# Patient Record
Sex: Female | Born: 1985 | Race: White | Hispanic: No | Marital: Married | Smoking: Former smoker
Health system: Southern US, Community
[De-identification: ages and names within clinical notes are randomized; demographics above are authoritative.]

## PROBLEM LIST (undated history)

## (undated) DIAGNOSIS — R7303 Prediabetes: Secondary | ICD-10-CM

## (undated) DIAGNOSIS — D369 Benign neoplasm, unspecified site: Secondary | ICD-10-CM

## (undated) DIAGNOSIS — E282 Polycystic ovarian syndrome: Secondary | ICD-10-CM

## (undated) DIAGNOSIS — N200 Calculus of kidney: Secondary | ICD-10-CM

## (undated) DIAGNOSIS — Z87442 Personal history of urinary calculi: Secondary | ICD-10-CM

## (undated) HISTORY — PX: DENTAL SURGERY: SHX609

## (undated) HISTORY — DX: Polycystic ovarian syndrome: E28.2

## (undated) HISTORY — PX: TONSILLECTOMY: SUR1361

## (undated) HISTORY — DX: Benign neoplasm, unspecified site: D36.9

## (undated) HISTORY — PX: UTERINE SEPTUM RESECTION: SHX5386

## (undated) HISTORY — DX: Prediabetes: R73.03

---
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
## 2006-07-16 ENCOUNTER — Ambulatory Visit (HOSPITAL_COMMUNITY): Admission: RE | Admit: 2006-07-16 | Discharge: 2006-07-16 | Payer: Self-pay | Admitting: Cardiology

## 2006-10-12 ENCOUNTER — Emergency Department (HOSPITAL_COMMUNITY): Admission: EM | Admit: 2006-10-12 | Discharge: 2006-10-12 | Payer: Self-pay | Admitting: Emergency Medicine

## 2007-04-25 ENCOUNTER — Emergency Department (HOSPITAL_COMMUNITY): Admission: EM | Admit: 2007-04-25 | Discharge: 2007-04-26 | Payer: Self-pay | Admitting: Emergency Medicine

## 2007-04-27 ENCOUNTER — Ambulatory Visit: Payer: Self-pay | Admitting: Orthopedic Surgery

## 2007-06-01 ENCOUNTER — Ambulatory Visit: Payer: Self-pay | Admitting: Orthopedic Surgery

## 2007-07-02 ENCOUNTER — Ambulatory Visit: Payer: Self-pay | Admitting: Orthopedic Surgery

## 2010-10-23 ENCOUNTER — Emergency Department (HOSPITAL_COMMUNITY): Admission: EM | Admit: 2010-10-23 | Discharge: 2010-10-23 | Payer: Self-pay | Admitting: Emergency Medicine

## 2011-02-16 ENCOUNTER — Emergency Department (HOSPITAL_COMMUNITY)
Admission: EM | Admit: 2011-02-16 | Discharge: 2011-02-17 | Disposition: A | Discharge status: Home / Self Care | Payer: Self-pay | Attending: Emergency Medicine | Admitting: Emergency Medicine

## 2011-02-16 DIAGNOSIS — M542 Cervicalgia: Secondary | ICD-10-CM | POA: Insufficient documentation

## 2011-02-16 DIAGNOSIS — X58XXXA Exposure to other specified factors, initial encounter: Secondary | ICD-10-CM | POA: Insufficient documentation

## 2011-02-16 DIAGNOSIS — S239XXA Sprain of unspecified parts of thorax, initial encounter: Secondary | ICD-10-CM | POA: Insufficient documentation

## 2011-02-16 DIAGNOSIS — M546 Pain in thoracic spine: Secondary | ICD-10-CM | POA: Insufficient documentation

## 2011-02-16 DIAGNOSIS — R071 Chest pain on breathing: Secondary | ICD-10-CM | POA: Insufficient documentation

## 2011-05-17 NOTE — Op Note (Signed)
Linda Burnett, Linda Burnett                        ACCOUNT NO.:  [id number]   MEDICAL RECORD NO.:  [id number]                   PATIENT TYPE:  AMB   LOCATION:  DSC                                  FACILITY:  [facility]   PHYSICIAN:  David L. Annalee Genta, M.D.            DATE OF BIRTH:  [birth year]   DATE OF PROCEDURE:  08/07/2004  DATE OF DISCHARGE:                                 OPERATIVE REPORT   PREOPERATIVE DIAGNOSES:  1. Recurrent acute tonsillitis.  2. Adenotonsillar hypertrophy.   POSTOPERATIVE DIAGNOSES:  1. Recurrent acute tonsillitis.  2. Adenotonsillar hypertrophy.   OPERATION PERFORMED:  Tonsillectomy and adenoidectomy (adenoid ablation).   SURGEON:  Kinnie Scales. Annalee Genta, M.D.   ANESTHESIA:  General endotracheal.   COMPLICATIONS:  None.   ESTIMATED BLOOD LOSS:  Minimal.   DISPOSITION:  Patient transferred from the operating room to the recovery  room in stable condition.   INDICATIONS FOR PROCEDURE:  Pa is an almost 25 year old white female  who was referred for evaluation of recurrent tonsillitis and significant  adenotonsillar hypertrophy.  The patient had numerous infections in the last  year requiring multiple courses of antibiotics.  Given her history and  physical findings, I recommended that we consider her for tonsillectomy and  adenoidectomy under general anesthesia.  The risks, benefits, and possible  complications of the surgical procedures were discussed in detail with the  patient and her mother, who understood and concurred with our plan for  surgery which was scheduled as above.   DESCRIPTION OF PROCEDURE:  The patient was brought to the operating room on  August 07, 2004 and placed in supine position on the operating table.  General endotracheal anesthesia was established without difficulty.  When  the patient was adequately anesthetized, a Crowe-Davis mouth gag was  inserted without difficulty.  There were no loose or broken teeth.  The  surgical  procedure was begun with adenoidectomy.  The posterior nasopharynx  was thoroughly examined.  There was significant adenoidal hypertrophy.  Using Bovie electrocautery the excessive adenoidal tissue was ablated.  With  the nasopharynx widely patent, attention was then turned to the tonsils.   Beginning on the left hand side and dissecting in subcapsular fashion, the  entire left tonsil was resected from superior pole to tongue base. There was  a moderate amount of scarring in the superior aspect of the tonsillar fossa  and a small amount of arterial bleeding which was cauterized with suction  cautery.  On the right hand side, a similar procedure was carried out using  a Harmonic scalpel and dissecting in subcapsular fashion, the entire right  tonsil was resected from the superior pole to tongue base.  Tonsillar tissue  was sent to pathology for gross and microscopic evaluation.  Tonsillar fossa  was gently abraded with a dry tonsil sponge.  Several areas of point  hemorrhage were cauterized.  Crowe-Davis mouth gag was released and  reapplied and there  was no active bleeding.  An orogastric tube was passed  and stomach contents were aspirated.  The patient's oral cavity, oropharynx,  nasal cavity and nasopharynx were then thoroughly irrigated and suctioned.  The patient's Crowe-Davis mouth gag was then released and removed.  There  were no loose or broken teeth and no active bleeding.  She was then awakened  from her anesthetic, extubated and transferred from the operating room to  the recovery room in stable condition.  There were no complications.                                               Kinnie Scales. Annalee Genta, M.D.    DLS/MEDQ  D:  16/09/9603  T:  08/07/2004  Job:  [id number]

## 2011-05-17 NOTE — Op Note (Signed)
NAMECORLENE, SABIA                        ACCOUNT NO.:  [id number]   MEDICAL RECORD NO.:  [id number]                   PATIENT TYPE:  OBV   LOCATION:  1824                                 FACILITY:  [facility]   PHYSICIAN:  Kinnie Scales. Annalee Genta, M.D.            DATE OF BIRTH:  1986/05/24   DATE OF PROCEDURE:  08/13/2004  DATE OF DISCHARGE:                                 OPERATIVE REPORT   PREOPERATIVE DIAGNOSIS:  1. Posttonsillectomy hemorrhage.  2. Status post tonsillectomy and adenoidectomy on August 07, 2004.   POSTOPERATIVE DIAGNOSIS:  1. Posttonsillectomy hemorrhage.  2. Status post tonsillectomy and adenoidectomy on August 07, 2004.   OPERATION PERFORMED:  1. Examination and cautery of posttonsillectomy hemorrhage.  2. Gastric lavage.   SURGEON:  Kinnie Scales. Annalee Genta, M.D.   ANESTHESIA:  General endotracheal.   COMPLICATIONS:  None.   ESTIMATED BLOOD LOSS:  Approximately 50 mL.   The patient was transferred from the operating room to the recovery room in  stable condition.   INDICATIONS FOR PROCEDURE:  The patient is a 25 year old white female who  underwent tonsillectomy and adenoidectomy under general anesthesia on August 07, 2004 at [facility].  Surgery was uneventful and was  performed for a history of recurrent acute tonsillitis, adenotonsillar  hypertrophy.  No blood loss at the time of surgery.  The patient tolerated  the procedure without difficulty and was discharged home later the same day.  On the morning on August 13, 2004, the patient had acute tonsillectomy  hemorrhage.  After waking in the morning, she developed acute bleeding and  she was transferred via EMS to [facility] Emergency [facility].  She was  evaluated in the emergency department and found to have significant clotting  in the right peritonsillar fossa.  Given her history and physical findings,  she was transferred from the emergency room to the operating room for  surgical  management of posttonsillectomy hemorrhage.   DESCRIPTION OF PROCEDURE:  The patient was brought to the operating room on  August 13, 2004 and placed in supine position on the operating table.  General endotracheal anesthesia was established without difficulty.  There  was no active bleeding and there was no evidence of aspiration. Once the  patient's airway had been secured and she was adequately anesthetized, the  Crowe-Davis mouth gag was inserted.  The patient's oral cavity and  oropharynx were examined.  There was heavy clotting in the right tonsillar  fossa and this was gently removed.  Several areas of point hemorrhage were  noted including a small blood vessel in the inferior aspect of the right  tonsillar fossa.  This was cauterized with suction cautery.  Tonsillar fossa  were then gently abraded with a dry tonsil sponge and several other small  areas of point hemorrhage were also cauterized with suction cautery and  there was no active bleeding at the conclusion of the procedure.  The CroweEarlene Plater mouth gag was released and reapplied.  Again, there was no active  bleeding.  A 16 French orogastric tube was passed without difficulty and the  patient's stomach was thoroughly lavaged with approximately 300 mL of warm  sterile saline.  A moderate amount of bloody clotted material was aspirated  from the patient's stomach.  This was continued until there was no further  clot within the stomach. The patient was then awakened from her anesthetic.  The Crowe-Davis mouth gag was released and removed.  There was no active  bleeding.  She was extubated and was transferred from the operating room to  the recovery room in stable condition.                                               Kinnie Scales. Annalee Genta, M.D.    DLS/MEDQ  D:  47/82/9562  T:  08/13/2004  Job:  [id number]

## 2014-03-08 ENCOUNTER — Ambulatory Visit (INDEPENDENT_AMBULATORY_CARE_PROVIDER_SITE_OTHER): Payer: PRIVATE HEALTH INSURANCE | Admitting: Obstetrics & Gynecology

## 2014-03-08 ENCOUNTER — Encounter (INDEPENDENT_AMBULATORY_CARE_PROVIDER_SITE_OTHER): Payer: Self-pay

## 2014-03-08 ENCOUNTER — Encounter: Payer: Self-pay | Admitting: Obstetrics & Gynecology

## 2014-03-08 VITALS — BP 110/90 | Ht 65.0 in | Wt 241.0 lb

## 2014-03-08 DIAGNOSIS — N912 Amenorrhea, unspecified: Secondary | ICD-10-CM

## 2014-03-08 MED ORDER — NITROFURANTOIN MONOHYD MACRO 100 MG PO CAPS: 100.0000 mg | ORAL_CAPSULE | Freq: Two times a day (BID) | ORAL | Status: DC

## 2014-03-08 MED ORDER — MEDROXYPROGESTERONE ACETATE 10 MG PO TABS: 10.0000 mg | ORAL_TABLET | Freq: Every day | ORAL | Status: DC

## 2014-03-08 MED ORDER — CLOMIPHENE CITRATE 50 MG PO TABS: 100.0000 mg | ORAL_TABLET | Freq: Every day | ORAL | Status: DC

## 2014-03-08 NOTE — Addendum Note (Signed)
Addended by: Florian Buff on: 03/08/2014 04:12 PM   Modules accepted: Orders

## 2014-03-08 NOTE — Progress Notes (Signed)
Patient ID: Sunni Richardson, female   DOB: 1986/10/18, 28 y.o.   MRN: [id number] November 2014 Semen ok by report Anovulatory  Infrequent intercourse  No meds  Provera Clomid and will do follicular dating  Past Medical History  Diagnosis Date  . Polycystic ovary syndrome     Past Surgical History  Procedure Laterality Date  . Tonsillectomy and adenoidectomy      OB History   Grav Para Term Preterm Abortions TAB SAB Ect Mult Living                  Not on File  History   Social History  . Marital Status: Single    Spouse Name: N/A    Number of Children: N/A  . Years of Education: N/A   Social History Main Topics  . Smoking status: Current Every Day Smoker  . Smokeless tobacco: None  . Alcohol Use: None  . Drug Use: None  . Sexual Activity: Yes   Other Topics Concern  . None   Social History Narrative  . None    Family History  Problem Relation Age of Onset  . Colon cancer Father   . Diabetes Maternal Grandmother   . Diabetes Paternal Grandmother   . Colon cancer Paternal Grandmother

## 2014-04-15 ENCOUNTER — Telehealth: Payer: Self-pay | Admitting: Obstetrics & Gynecology

## 2014-04-15 NOTE — Telephone Encounter (Signed)
Pt started period today when does she need to start Clomid. Pt informed to start Clomid today. Pt states Dr. Elonda Husky wanted to do follicullar dating. Appointment for ultrasound and Dr. Elonda Husky made for April 28, 2014 day 14 of cycle.

## 2014-04-27 ENCOUNTER — Other Ambulatory Visit: Payer: Self-pay | Admitting: Obstetrics & Gynecology

## 2014-04-27 DIAGNOSIS — N970 Female infertility associated with anovulation: Secondary | ICD-10-CM

## 2014-04-28 ENCOUNTER — Ambulatory Visit (INDEPENDENT_AMBULATORY_CARE_PROVIDER_SITE_OTHER): Payer: PRIVATE HEALTH INSURANCE

## 2014-04-28 ENCOUNTER — Encounter: Payer: Self-pay | Admitting: Obstetrics & Gynecology

## 2014-04-28 ENCOUNTER — Ambulatory Visit (INDEPENDENT_AMBULATORY_CARE_PROVIDER_SITE_OTHER): Payer: PRIVATE HEALTH INSURANCE | Admitting: Obstetrics & Gynecology

## 2014-04-28 VITALS — BP 128/80 | Wt 241.0 lb

## 2014-04-28 DIAGNOSIS — N912 Amenorrhea, unspecified: Secondary | ICD-10-CM

## 2014-04-28 DIAGNOSIS — N970 Female infertility associated with anovulation: Secondary | ICD-10-CM

## 2014-04-28 MED ORDER — MEDROXYPROGESTERONE ACETATE 10 MG PO TABS: 10.0000 mg | ORAL_TABLET | Freq: Every day | ORAL | Status: DC

## 2014-04-28 MED ORDER — CLOMIPHENE CITRATE 50 MG PO TABS: ORAL_TABLET | ORAL | Status: DC

## 2014-04-28 NOTE — Progress Notes (Signed)
Patient ID: Linda Burnett, female   DOB: [birth year], 28 y.o.   MRN: [id number] Anovulatory On clomid 100 mg daily  US Transvaginal Non-ob  04/28/2014   GYNECOLOGIC SONOGRAM   Linda Burnett is a 28 y.o.  for a pelvic sonogram for infertility  (anovulation). LMP is 04/15/2014 (Day 14) pt had taken clomid on days 1-5  Uterus                      8.3 x 4.9 x 3.8 cm, anteverted uterus noted no  myometrial masses noted within  Endometrium          13.1 mm, symmetrical, small amount of fluid noted  within cervical cavity  Right ovary             4.1 x 2.3 x 2.0 cm, largest follicle=12 x 24mm all  others 32mm or less  Left ovary                3.3 x 2.5 x 2.3 cm, largest follicles noted 7 &  6 mm  No free fluid noted within pelvis   Technician Comments:  Anteverted uterus noted, Endom-13.15mm,  Small amount of fluid noted within  cervical cavity, bilateral adnexa/ovaries appear WNL no free fluid or  adnexal masses noted    Linda Burnett 04/28/2014 11:27 AM   Clinical Impression and recommendations:  I have reviewed the sonogram results above, combined with the patient's  current clinical course, below are my impressions and any appropriate  recommendations for management based on the sonographic findings.  No dominant follicle Good estrogenic effect of endometrium  Increase to 150 mg  Linda Burnett 04/28/2014 11:40 AM   No dominat follicle  Increase to 093 mg daily Provera given Call with menses

## 2014-05-12 ENCOUNTER — Telehealth: Payer: Self-pay | Admitting: Obstetrics & Gynecology

## 2014-05-12 ENCOUNTER — Telehealth: Payer: Self-pay | Admitting: Adult Health

## 2014-05-12 NOTE — Telephone Encounter (Signed)
Left message x 1. JSY 

## 2014-05-12 NOTE — Telephone Encounter (Signed)
Yes related to period yes she needs to make appointment for sonogram for 05/25/2014 the see me

## 2014-05-12 NOTE — Telephone Encounter (Signed)
Spoke with pt. Pt started her period last pm and started Clomid this am. Pt states she is having pain in her pelvic area that goes into her legs. Pt wonders if it could be coming from the Provera. I advised it could be period related, but pt states she has never had this before. Pt also wonders if she will need another internal [place]. Please advise. Thanks!!!

## 2014-05-12 NOTE — Telephone Encounter (Signed)
Linda Burnett spoke with pt and gave Dr. Brynda Greathouse recommendations. [place]

## 2014-05-12 NOTE — Telephone Encounter (Signed)
Pt informed per Dr. Elonda Husky pain in pelvic area and legs related to period. Call transferred to front staff for an appt with Dr. Elonda Husky and sonogram to be made 05/25/2014.

## 2014-05-24 ENCOUNTER — Other Ambulatory Visit: Payer: Self-pay | Admitting: Obstetrics & Gynecology

## 2014-05-24 DIAGNOSIS — N949 Unspecified condition associated with female genital organs and menstrual cycle: Secondary | ICD-10-CM

## 2014-05-25 ENCOUNTER — Other Ambulatory Visit: Payer: Self-pay | Admitting: Obstetrics & Gynecology

## 2014-05-25 ENCOUNTER — Ambulatory Visit (INDEPENDENT_AMBULATORY_CARE_PROVIDER_SITE_OTHER): Payer: PRIVATE HEALTH INSURANCE | Admitting: Obstetrics & Gynecology

## 2014-05-25 ENCOUNTER — Ambulatory Visit (INDEPENDENT_AMBULATORY_CARE_PROVIDER_SITE_OTHER): Payer: PRIVATE HEALTH INSURANCE

## 2014-05-25 ENCOUNTER — Encounter: Payer: Self-pay | Admitting: Obstetrics & Gynecology

## 2014-05-25 VITALS — BP 122/80 | Wt 238.0 lb

## 2014-05-25 DIAGNOSIS — E282 Polycystic ovarian syndrome: Secondary | ICD-10-CM

## 2014-05-25 DIAGNOSIS — N970 Female infertility associated with anovulation: Secondary | ICD-10-CM

## 2014-05-25 DIAGNOSIS — N949 Unspecified condition associated with female genital organs and menstrual cycle: Secondary | ICD-10-CM

## 2014-05-25 DIAGNOSIS — IMO0002 Reserved for concepts with insufficient information to code with codable children: Secondary | ICD-10-CM

## 2014-05-25 DIAGNOSIS — N946 Dysmenorrhea, unspecified: Secondary | ICD-10-CM

## 2014-05-25 DIAGNOSIS — N979 Female infertility, unspecified: Secondary | ICD-10-CM

## 2014-05-25 MED ORDER — METFORMIN HCL 500 MG PO TABS: 500.0000 mg | ORAL_TABLET | Freq: Two times a day (BID) | ORAL | Status: DC

## 2014-05-25 MED ORDER — DESOGESTREL-ETHINYL ESTRADIOL 0.15-30 MG-MCG PO TABS: 1.0000 | ORAL_TABLET | Freq: Every day | ORAL | Status: DC

## 2014-08-29 ENCOUNTER — Other Ambulatory Visit: Payer: Self-pay | Admitting: Obstetrics & Gynecology

## 2014-08-29 DIAGNOSIS — N970 Female infertility associated with anovulation: Secondary | ICD-10-CM | POA: Insufficient documentation

## 2014-08-29 NOTE — Progress Notes (Signed)
Patient ID: Linda Burnett, female   DOB: 07-Aug-1986, 28 y.o.   MRN: [id number] Linda Burnett is a 28 y.o. No obstetric history on file. LMP 05/11/2014 for a pelvic sonogram for dysmenorrhea and PCO. Pt had 150mg  Clomid on days 1-5, today is day 15 of cycle.  Uterus 7.7 x 4.9 x 3.6 cm, anteverted  Endometrium 10.7 mm, symmetrical,  Right ovary 3.3 x 2.3 x 4.0ZJ multiple follicles noted all <0DU  Left ovary 3.3 x 2.7 x 2.2 cm, multiple follicles noted all <4RC  No free fluid or adnexal masses noted within the uterus  Technician Comments:  Anteverted uterus, Endom-10.80mm, bilateral adnexa/ovaries size/shape appears WNL, bilateral ovaries appear with multiple follicles noted which all measure <1cm.  Alicia Amel  05/25/2014  3:02 PM  No dominant follicle on Clomid 381 mg  Otherwise normal sonogram  Florian Buff  05/25/2014  3:30 PM       Result History    US Transvaginal Non-OB (Order [id number]) on 5   Sonogram above reveals no dominant follicles On 360 mg days 1-5  Will add metformin 500 BID to augment the clomid in a PCOS type anovulatory patient Pt to call if problems with metformin  Recommend pt consider follow up with [facility] for ovulation induction, discussed

## 2015-01-30 ENCOUNTER — Telehealth: Payer: Self-pay | Admitting: *Deleted

## 2015-01-30 NOTE — Telephone Encounter (Signed)
Pt states has had 2 positive pregnancy test at home, LMP 11/2014. Does she need to have blood work due to her history of irregular periods and anovulation. Call transferred to front staff for an appt to be scheduled for pregnancy test.

## 2015-01-31 ENCOUNTER — Encounter: Payer: PRIVATE HEALTH INSURANCE | Admitting: Adult Health

## 2015-09-29 ENCOUNTER — Encounter: Payer: Self-pay | Admitting: Cardiology

## 2015-09-29 ENCOUNTER — Ambulatory Visit (INDEPENDENT_AMBULATORY_CARE_PROVIDER_SITE_OTHER): Payer: BLUE CROSS/BLUE SHIELD | Admitting: Cardiology

## 2015-09-29 VITALS — BP 113/78 | HR 54 | Ht 65.0 in | Wt 236.0 lb

## 2015-09-29 DIAGNOSIS — Z8249 Family history of ischemic heart disease and other diseases of the circulatory system: Secondary | ICD-10-CM

## 2015-09-29 DIAGNOSIS — Z136 Encounter for screening for cardiovascular disorders: Secondary | ICD-10-CM | POA: Diagnosis not present

## 2015-09-29 DIAGNOSIS — R7303 Prediabetes: Secondary | ICD-10-CM

## 2015-09-29 DIAGNOSIS — R7309 Other abnormal glucose: Secondary | ICD-10-CM | POA: Diagnosis not present

## 2015-09-29 DIAGNOSIS — R002 Palpitations: Secondary | ICD-10-CM | POA: Diagnosis not present

## 2015-09-29 NOTE — Patient Instructions (Signed)
Your physician recommends that you continue on your current medications as directed. Please refer to the Current Medication list given to you today. Your physician has recommended that you wear an event monitor for 14 days. Event monitors are medical devices that record the heart's electrical activity. Doctors most often [place] these monitors to diagnose arrhythmias. Arrhythmias are problems with the speed or rhythm of the heartbeat. The monitor is a small, portable device. You can wear one while you do your normal daily activities. This is usually used to diagnose what is causing palpitations/syncope (passing out). We will call you with your results.

## 2015-09-29 NOTE — Progress Notes (Signed)
Cardiology Office Note  Date: 09/29/2015   ID: Linda Burnett, DOB November 13, 1986, MRN [id number]  PCP: BURNS, Chrystine Oiler, MD  Consulting Cardiologist: Rozann Lesches, MD   Chief Complaint  Patient presents with  . Palpitations    History of Present Illness: Linda Burnett is a 29 y.o. female referred for cardiology consultation by Dr. Quay Burow. She reports a history of palpitations, noted over a period a several weeks, and at their worst occurring at least 3 times a week. She reported no obvious trigger for these symptoms, felt like her heart was irregular and rapid. Generally the symptoms would last for about 15-20 minutes and then resolve. She had no lightheadedness or syncope, no chest discomfort. Over the last month or so she has stopped smoking and also cut back caffeine significantly. This has resulted in significant decrease in feeling of palpitations.  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 States that she is now considering a regular exercise program. She seems to be focused on improving her overall health, states that her uncle died in his 54s with a myocardial infarction.  ECG done today shows sinus bradycardia with nonspecific T-wave changes.   Past Medical History  Diagnosis Date  . Polycystic ovary syndrome   . Prediabetes     Past Surgical History  Procedure Laterality Date  . Tonsillectomy    . Dental surgery      Current Outpatient Prescriptions  Medication Sig Dispense Refill  . metFORMIN (GLUCOPHAGE) 500 MG tablet Take 1 tablet (500 mg total) by mouth 2 (two) times daily with a meal. 60 tablet 2   No current facility-administered medications for this visit.    Allergies:  Review of patient's allergies indicates not on file.   Social History: The patient  reports that she quit smoking about 5 weeks ago. Her smoking use included Cigarettes. She does not have any smokeless tobacco history on file. She reports that she does not drink  alcohol or use illicit drugs.   Family History: The patient's family history includes Colon cancer in her father and paternal grandmother; Diabetes in her maternal grandmother and paternal grandmother; Heart attack in her maternal uncle.   ROS:  Please see the history of present illness. Otherwise, complete review of systems is positive for none.  All other systems are reviewed and negative.   Physical Exam: VS:  BP 113/78 mmHg  Pulse 54  Ht 5\' 5"  (1.651 m)  Wt 236 lb (107.049 kg)  BMI 39.27 kg/m2  SpO2 98%, BMI Body mass index is 39.27 kg/(m^2).  Wt Readings from Last 3 Encounters:  09/29/15 236 lb (107.049 kg)  05/25/14 238 lb (107.956 kg)  04/28/14 241 lb (109.317 kg)     General: Overweight young woman, appears comfortable at rest. HEENT: Conjunctiva and lids normal, oropharynx clear. Neck: Supple, no elevated JVP or carotid bruits, no thyromegaly. Lungs: Clear to auscultation, nonlabored breathing at rest. Cardiac: Regular rate and rhythm, no S3 or significant systolic murmur, no pericardial rub. Abdomen: Soft, nontender, bowel sounds present. Extremities: No pitting edema, distal pulses 2+. Skin: Warm and dry. Musculoskeletal: No kyphosis. Neuropsychiatric: Alert and oriented x3, affect grossly appropriate.   ECG: ECG is ordered today.   Recent Labwork:  September 2016: Hemoglobin 14.1, platelets 383, BUN 11, creatinine 0.5, potassium 4.5, AST 16, ALT 34, hemoglobin A1c 6.1, TSH 0.9  Assessment and Plan:  1. Palpitations, decreased but not absent following smoking cessation and reduction in caffeine use. ECG shows sinus bradycardia with nonspecific  T-wave changes. She has had no syncope or chest pain with symptoms. Most likely benign, but would like to exclude any other significant arrhythmias such as atrial fibrillation since she has described some irregularity and rapidity to heart rate with symptoms. We will plan a 14 day event monitor and call her with the  results.  2. Prediabetes, actually now on metformin per her PCP.  3. Polycystic ovary syndrome.  Current medicines were reviewed with the patient today.   Orders Placed This Encounter  Procedures  . Cardiac event monitor  . EKG 12-Lead    Disposition: Call with results.   Signed, Satira Sark, MD, [facility] 09/29/2015 8:56 AM    [facility] [address], [place], [place] [address] Phone: [phone]; Fax: [phone]

## 2015-10-03 ENCOUNTER — Ambulatory Visit (INDEPENDENT_AMBULATORY_CARE_PROVIDER_SITE_OTHER): Payer: BLUE CROSS/BLUE SHIELD

## 2015-10-03 DIAGNOSIS — R002 Palpitations: Secondary | ICD-10-CM | POA: Diagnosis not present

## 2015-10-17 ENCOUNTER — Telehealth: Payer: Self-pay | Admitting: *Deleted

## 2015-10-17 NOTE — Telephone Encounter (Signed)
Patient informed. 

## 2015-10-17 NOTE — Telephone Encounter (Signed)
-----   Message from Satira Sark, MD sent at 10/17/2015  1:01 PM EDT ----- Reviewed. Please let her know that the monitor did not identify any obvious arrhythmias.

## 2015-11-13 ENCOUNTER — Ambulatory Visit: Payer: PRIVATE HEALTH INSURANCE | Admitting: Adult Health

## 2015-12-31 DIAGNOSIS — D369 Benign neoplasm, unspecified site: Secondary | ICD-10-CM

## 2015-12-31 HISTORY — DX: Benign neoplasm, unspecified site: D36.9

## 2016-03-13 ENCOUNTER — Telehealth: Payer: Self-pay

## 2016-03-13 NOTE — Telephone Encounter (Signed)
I called and informed pt of Leslie's recommendation. She is at work and will call me back tomorrow.  ( Her father was Teena Irani) 01/11/1964.

## 2016-03-13 NOTE — Telephone Encounter (Signed)
Pt was referred by Dr. Murrell Redden office for a colonoscopy due to family hx in her father diagnosed at age 29.  She said her father comes here to see Dr. Gala Romney and I have given that info to Neil Crouch, PA and and she will advise.

## 2016-03-13 NOTE — Telephone Encounter (Signed)
Dad had stage III disease at time of diagnosis at age 29. Grandmother also had colon cancer with a recurrence.  I would recommend high risk screening colonoscopy. She should check with her insurance to make sure it will be covered. They may go with strict "10 years before age of diagnosis of family member" but given multiple family members with colon cancer and advanced disease of father when diagnoed at age 26 it is reasonable to get baseline now.

## 2016-03-14 ENCOUNTER — Other Ambulatory Visit: Payer: Self-pay

## 2016-03-14 DIAGNOSIS — Z800 Family history of malignant neoplasm of digestive organs: Secondary | ICD-10-CM

## 2016-03-14 NOTE — Telephone Encounter (Signed)
Gastroenterology Pre-Procedure Review  Request Date: 03/14/2016 Requesting Physician: Lars Mage NP ( at Dr. 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 Joint replacements in the past 12 months: no 3. Major health problems in the past 3 months: no 4. Has an artificial valve or MVP: no 5. Has a defibrillator: no 6. Has been advised in past to take antibiotics in advance of a procedure like teeth cleaning: no 7. Family history of colon cancer: no  8. Alcohol Use: no 9. History of sleep apnea: no     MEDICATIONS & ALLERGIES:    Patient reports the following regarding taking any blood thinners:   Plavix? no Aspirin? no Coumadin? no  Patient confirms/reports the following medications:  Current Outpatient Prescriptions  Medication Sig Dispense Refill  . metFORMIN (GLUCOPHAGE) 500 MG tablet Take 1 tablet (500 mg total) by mouth 2 (two) times daily with a meal. (Patient not taking: Reported on 03/14/2016) 60 tablet 2   No current facility-administered medications for this visit.    Patient confirms/reports the following allergies:  No Known Allergies  No orders of the defined types were placed in this encounter.    AUTHORIZATION INFORMATION Primary Insurance:  ID #: Group #: Pre-Cert / Auth required:  Pre-Cert / Auth #:   Secondary Insurance:  ID #:   Group #:  Pre-Cert / Auth required:  Pre-Cert / Auth #:   SCHEDULE INFORMATION: Procedure has been scheduled as follows:  Date:  04/05/2016                 Time: 10:30 Am Location: [facility] Short Stay  This Gastroenterology  Pre-Precedure Review Form is being routed to the following provider(s): R. Garfield Cornea, MD

## 2016-03-15 NOTE — Telephone Encounter (Signed)
OK to schedule.  When she starts metformin, the day of her prep she will need to take 1/2 dose.

## 2016-03-18 MED ORDER — PEG 3350-KCL-NA BICARB-NACL 420 G PO SOLR: 4000.0000 mL | ORAL | Status: DC

## 2016-03-18 NOTE — Telephone Encounter (Signed)
Pt is aware to check with her insurance.

## 2016-03-18 NOTE — Telephone Encounter (Signed)
Rx sent to the pharmacy and instructions mailed to pt.  

## 2016-03-18 NOTE — Telephone Encounter (Signed)
LMOM for pt to call. Will remind her again to call her insurance and see if they will pay or how much they will pay for her colonoscopy.

## 2016-04-01 ENCOUNTER — Telehealth: Payer: Self-pay

## 2016-04-01 NOTE — Telephone Encounter (Signed)
I called [employer] @ [phone] and spoke to [facility] O who said that a PA is not required for the high risk screening colonoscopy as out patient at [facility].

## 2016-04-03 ENCOUNTER — Telehealth: Payer: Self-pay

## 2016-04-03 NOTE — Telephone Encounter (Signed)
Pt called to say she misplaced her instructions for her colonoscopy which is scheduled for Friday. I faxed the instructions to her at [phone] per her request and she will call me if she does not receive them right away.

## 2016-04-05 ENCOUNTER — Ambulatory Visit (HOSPITAL_COMMUNITY)
Admission: RE | Admit: 2016-04-05 | Discharge: 2016-04-05 | Disposition: A | Discharge status: Home / Self Care | Payer: BLUE CROSS/BLUE SHIELD | Source: Ambulatory Visit | Attending: Internal Medicine | Admitting: Internal Medicine

## 2016-04-05 ENCOUNTER — Encounter (HOSPITAL_COMMUNITY): Payer: Self-pay | Admitting: *Deleted

## 2016-04-05 ENCOUNTER — Encounter (HOSPITAL_COMMUNITY)
Admission: RE | Disposition: A | Discharge status: Home / Self Care | Payer: Self-pay | Source: Ambulatory Visit | Attending: Internal Medicine

## 2016-04-05 DIAGNOSIS — Z800 Family history of malignant neoplasm of digestive organs: Secondary | ICD-10-CM

## 2016-04-05 DIAGNOSIS — Z87891 Personal history of nicotine dependence: Secondary | ICD-10-CM | POA: Insufficient documentation

## 2016-04-05 DIAGNOSIS — Z860100 Personal history of colon polyps, unspecified: Secondary | ICD-10-CM | POA: Insufficient documentation

## 2016-04-05 DIAGNOSIS — D123 Benign neoplasm of transverse colon: Secondary | ICD-10-CM | POA: Diagnosis not present

## 2016-04-05 DIAGNOSIS — D124 Benign neoplasm of descending colon: Secondary | ICD-10-CM | POA: Diagnosis not present

## 2016-04-05 DIAGNOSIS — Z1211 Encounter for screening for malignant neoplasm of colon: Secondary | ICD-10-CM | POA: Diagnosis present

## 2016-04-05 HISTORY — PX: COLONOSCOPY: SHX5424

## 2016-04-05 LAB — PREGNANCY, URINE: Preg Test, Ur: NEGATIVE

## 2016-04-05 SURGERY — COLONOSCOPY
Anesthesia: Moderate Sedation

## 2016-04-05 MED ORDER — ONDANSETRON HCL 4 MG/2ML IJ SOLN
INTRAMUSCULAR | Status: AC
  Filled 2016-04-05: qty 2

## 2016-04-05 MED ORDER — ONDANSETRON HCL 4 MG/2ML IJ SOLN
INTRAMUSCULAR | Status: DC | PRN
  Administered 2016-04-05: 4 mg via INTRAVENOUS

## 2016-04-05 MED ORDER — MEPERIDINE HCL 100 MG/ML IJ SOLN
INTRAMUSCULAR | Status: DC | PRN
  Administered 2016-04-05: 25 mg via INTRAVENOUS
  Administered 2016-04-05: 50 mg via INTRAVENOUS
  Administered 2016-04-05: 25 mg via INTRAVENOUS

## 2016-04-05 MED ORDER — MEPERIDINE HCL 100 MG/ML IJ SOLN
INTRAMUSCULAR | Status: DC
Start: 2016-04-05 — End: 2016-04-05
  Filled 2016-04-05: qty 2

## 2016-04-05 MED ORDER — MIDAZOLAM HCL 5 MG/5ML IJ SOLN
INTRAMUSCULAR | Status: AC
  Filled 2016-04-05: qty 10

## 2016-04-05 MED ORDER — MIDAZOLAM HCL 5 MG/5ML IJ SOLN
INTRAMUSCULAR | Status: DC | PRN
  Administered 2016-04-05: 1 mg via INTRAVENOUS
  Administered 2016-04-05: 2 mg via INTRAVENOUS
  Administered 2016-04-05 (×2): 1 mg via INTRAVENOUS

## 2016-04-05 MED ORDER — SODIUM CHLORIDE 0.9 % IV SOLN
INTRAVENOUS | Status: DC
  Administered 2016-04-05: 09:00:00 via INTRAVENOUS

## 2016-04-05 MED ORDER — STERILE WATER FOR IRRIGATION IR SOLN
Status: DC | PRN
  Administered 2016-04-05: 10:00:00

## 2016-04-05 NOTE — Discharge Instructions (Signed)
°Colonoscopy °Discharge Instructions ° °Read the instructions outlined below and refer to this sheet in the next few weeks. These discharge instructions provide you with general information on caring for yourself after you leave the hospital. Your doctor may also give you specific instructions. While your treatment has been planned according to the most current medical practices available, unavoidable complications occasionally occur. If you have any problems or questions after discharge, call Dr. Yoselyn Burnett at [phone]. °ACTIVITY °· You may resume your regular activity, but move at a slower pace for the next 24 hours.  °· Take frequent rest periods for the next 24 hours.  °· Walking will help get rid of the air and reduce the bloated feeling in your belly (abdomen).  °· No driving for 24 hours (because of the medicine (anesthesia) used during the test).   °· Do not sign any important legal documents or operate any machinery for 24 hours (because of the anesthesia used during the test).  °NUTRITION °· Drink plenty of fluids.  °· You may resume your normal diet as instructed by your doctor.  °· Begin with a light meal and progress to your normal diet. Heavy or fried foods are harder to digest and may make you feel sick to your stomach (nauseated).  °· Avoid alcoholic beverages for 24 hours or as instructed.  °MEDICATIONS °· You may resume your normal medications unless your doctor tells you otherwise.  °WHAT YOU CAN EXPECT TODAY °· Some feelings of bloating in the abdomen.  °· Passage of more gas than usual.  °· Spotting of blood in your stool or on the toilet paper.  °IF YOU HAD POLYPS REMOVED DURING THE COLONOSCOPY: °· No aspirin products for 7 days or as instructed.  °· No alcohol for 7 days or as instructed.  °· Eat a soft diet for the next 24 hours.  °FINDING OUT THE RESULTS OF YOUR TEST °Not all test results are available during your visit. If your test results are not back during the visit, make an appointment  with your caregiver to find out the results. Do not assume everything is normal if you have not heard from your caregiver or the medical facility. It is important for you to follow up on all of your test results.  °SEEK IMMEDIATE MEDICAL ATTENTION IF: °· You have more than a spotting of blood in your stool.  °· Your belly is swollen (abdominal distention).  °· You are nauseated or vomiting.  °· You have a temperature over 101.  °· You have abdominal pain or discomfort that is severe or gets worse throughout the day.  ° ° °Colon polyp information provided °Further recommendations to follow pending review of pathology report ° ° °Colon Polyps °Polyps are lumps of extra tissue growing inside the body. Polyps can grow in the large intestine (colon). Most colon polyps are noncancerous (benign). However, some colon polyps can become cancerous over time. Polyps that are larger than a pea may be harmful. To be safe, caregivers remove and test all polyps. °CAUSES  °Polyps form when mutations in the genes cause your cells to grow and divide even though no more tissue is needed. °RISK FACTORS °There are a number of risk factors that can increase your chances of getting colon polyps. They include: °· Being older than 50 years. °· Family history of colon polyps or colon cancer. °· Long-term colon diseases, such as colitis or Crohn disease. °· Being overweight. °· Smoking. °· Being inactive. °· Drinking too much alcohol. °  SYMPTOMS  °Most small polyps do not cause symptoms. If symptoms are present, they may include: °· Blood in the stool. The stool may look dark red or black. °· Constipation or diarrhea that lasts longer than 1 week. °DIAGNOSIS °People often do not know they have polyps until their caregiver finds them during a regular checkup. Your caregiver can use 4 tests to check for polyps: °· Digital rectal exam. The caregiver wears gloves and feels inside the rectum. This test would find polyps only in the rectum. °· Barium  enema. The caregiver puts a liquid called barium into your rectum before taking X-rays of your colon. Barium makes your colon look white. Polyps are dark, so they are easy to see in the X-ray pictures. °· Sigmoidoscopy. A thin, flexible tube (sigmoidoscope) is placed into your rectum. The sigmoidoscope has a light and tiny camera in it. The caregiver uses the sigmoidoscope to look at the last third of your colon. °· Colonoscopy. This test is like sigmoidoscopy, but the caregiver looks at the entire colon. This is the most common method for finding and removing polyps. °TREATMENT  °Any polyps will be removed during a sigmoidoscopy or colonoscopy. The polyps are then tested for cancer. °PREVENTION  °To help lower your risk of getting more colon polyps: °· Eat plenty of fruits and vegetables. Avoid eating fatty foods. °· Do not smoke. °· Avoid drinking alcohol. °· Exercise every day. °· Lose weight if recommended by your caregiver. °· Eat plenty of calcium and folate. Foods that are rich in calcium include milk, cheese, and broccoli. Foods that are rich in folate include chickpeas, kidney beans, and spinach. °HOME CARE INSTRUCTIONS °Keep all follow-up appointments as directed by your caregiver. You may need periodic exams to check for polyps. °SEEK MEDICAL CARE IF: °You notice bleeding during a bowel movement. °  °This information is not intended to replace advice given to you by your health care provider. Make sure you discuss any questions you have with your health care provider. °  °Document Released: 09/11/2004 Document Revised: 01/06/2015 Document Reviewed: 02/25/2012 °Elsevier Interactive Patient Education ©2016 [employer] ° °

## 2016-04-05 NOTE — Op Note (Signed)
[facility] Patient Name: Linda Burnett Procedure Date: 04/05/2016 9:50 AM MRN: [id number] Date of Birth: 1986-05-19 Attending MD: Norvel Richards , MD CSN: [id number] Age: 30 Admit Type: Outpatient Procedure:                Colonoscopy Indications:              Screening in patient at increased risk: Colorectal                            cancer in father before age 26 Providers:                Norvel Richards, MD, Hinton Rao, RN,                            Randa Spike, Technician Referring MD:             Dione Housekeeper (Referring MD) Medicines:                Midazolam 6 mg IV, Meperidine 100 mg IV,                            Ondansetron 4 mg IV Complications:            No immediate complications. Estimated Blood Loss:     Estimated blood loss was minimal. Procedure:                Pre-Anesthesia Assessment:                           - Prior to the procedure, a History and Physical                            was performed, and patient medications and                            allergies were reviewed. The patient's tolerance of                            previous anesthesia was also reviewed. The risks                            and benefits of the procedure and the sedation                            options and risks were discussed with the patient.                            All questions were answered, and informed consent                            was obtained. Prior Anticoagulants: The patient has                            taken no previous anticoagulant or antiplatelet  agents. ASA Grade Assessment: II - A patient with                            mild systemic disease. After reviewing the risks                            and benefits, the patient was deemed in                            satisfactory condition to undergo the procedure.                           After obtaining informed consent, the colonoscope                             was passed under direct vision. Throughout the                            procedure, the patient's blood pressure, pulse, and                            oxygen saturations were monitored continuously. The                            EC-[id number] scope was introduced through                            the anus and advanced to the the terminal ileum.                            The colonoscopy was performed without difficulty.                            The patient tolerated the procedure well. The                            quality of the bowel preparation was adequate. The                            terminal ileum, ileocecal valve, appendiceal                            orifice, and rectum were photographed. The entire                            colon was examined. Scope In: 10:08:52 AM Scope Out: 10:28:20 AM Scope Withdrawal Time: 0 hours 11 minutes 56 seconds  Total Procedure Duration: 0 hours 19 minutes 28 seconds  Findings:      The perianal and digital rectal examinations were normal.      (2) 4 mm polyps was found in the hepatic flexure. polyps were       pedunculated. polyps was removed with a cold snare. Resection and       retrieval were complete. Estimated blood loss was minimal.  A 4 mm polyp was found in the descending colon. The polyp was       pedunculated. The polyp was removed with a cold snare. Resection and       retrieval were complete. Estimated blood loss: none.      The terminal ileum appeared normal.      The exam was otherwise without abnormality on direct and retroflexion       views. Impression:               - (2) 4 mm polyp at the hepatic flexure, removed                            with a cold snare. Resected and retrieved.                           - One 4 mm polyp in the descending colon, removed                            with a cold snare. Resected and retrieved.                           - The examined portion of the ileum was  normal.                           - The examination was otherwise normal on direct                            and retroflexion views. Moderate Sedation:      Moderate (conscious) sedation was administered by the endoscopy nurse       and supervised by the endoscopist. The following parameters were       monitored: oxygen saturation, heart rate, blood pressure, respiratory       rate, EKG, adequacy of pulmonary ventilation, and response to care.       Total physician intraservice time was 28 minutes. Recommendation:           - Patient has a contact number available for                            emergencies. The signs and symptoms of potential                            delayed complications were discussed with the                            patient. Return to normal activities tomorrow.                            Written discharge instructions were provided to the                            patient.                           - Advance diet as tolerated.                           -  Await pathology results.                           - Repeat colonoscopy date to be determined after                            pending pathology results are reviewed for                            surveillance based on pathology results.                           - Return to GI office at appointment to be                            scheduled.                           - Continue present medications. Procedure Code(s):        --- Professional ---                           [phone], Colonoscopy, flexible; with removal of                            tumor(s), polyp(s), or other lesion(s) by snare                            technique                           99152, Moderate sedation services provided by the                            same physician or other qualified health care                            professional performing the diagnostic or                            therapeutic service that the sedation  supports,                            requiring the presence of an independent trained                            observer to assist in the monitoring of the                            patient's level of consciousness and physiological                            status; initial 15 minutes of intraservice time,                            patient age 56  years or older                           [phone], Moderate sedation services; each additional                            15 minutes intraservice time Diagnosis Code(s):        --- Professional ---                           Z80.0, Family history of malignant neoplasm of                            digestive organs                           D12.3, Benign neoplasm of transverse colon (hepatic                            flexure or splenic flexure)                           D12.4, Benign neoplasm of descending colon CPT copyright 2016 American Medical Association. All rights reserved. The codes documented in this report are preliminary and upon coder review may  be revised to meet current compliance requirements. Cristopher Estimable. Darran Gabay, MD Norvel Richards, MD 04/05/2016 10:40:01 AM This report has been signed electronically. Number of Addenda: 0

## 2016-04-05 NOTE — H&P (Signed)
@  [id number]   Primary Care Physician:  BURNS, Chrystine Oiler, MD Primary Gastroenterologist:  Dr. Gala Romney  Pre-Procedure History & Physical: HPI:  Linda Burnett is a 30 y.o. female is here for a screening colonoscopy. Here for first ever high risk screening  Examination.   No bowel symptoms. Family history most significant for father diagnosed with stage III colon cancer at age 29. Paternal grand mother with colon cancer. Paternal uncle with precancerous colon polyps.  Past Medical History  Diagnosis Date  . Polycystic ovary syndrome   . Prediabetes     Past Surgical History  Procedure Laterality Date  . Tonsillectomy    . Dental surgery      Prior to Admission medications   Medication Sig Start Date End Date Taking? Authorizing Provider  polyethylene glycol-electrolytes (TRILYTE) 420 g solution Take 4,000 mLs by mouth as directed. 03/18/16  Yes Daneil Dolin, MD    Allergies as of 03/14/2016  . (No Known Allergies)    Family History  Problem Relation Age of Onset  . Colon cancer Father   . Diabetes Maternal Grandmother   . Diabetes Paternal Grandmother   . Colon cancer Paternal Grandmother   . Heart attack Maternal Uncle     Died age 77     Social History   Social History  . Marital Status: Married    Spouse Name: N/A  . Number of Children: N/A  . Years of Education: N/A   Occupational History  . Not on file.   Social History Main Topics  . Smoking status: Former Smoker    Types: Cigarettes    Quit date: 08/21/2015  . Smokeless tobacco: Not on file  . Alcohol Use: No  . Drug Use: No  . Sexual Activity: Yes   Other Topics Concern  . Not on file   Social History Narrative    Review of Systems: See HPI, otherwise negative ROS  Physical Exam: BP 111/68 mmHg  Pulse 62  Temp(Src) 98.2 F (36.8 C) (Oral)  Resp 16  Ht 5\' 5"  (1.651 m)  Wt 240 lb (108.863 kg)  BMI 39.94 kg/m2  SpO2 99%  LMP 12/07/2015 General:   Alert,  Well-developed, well-nourished,  pleasant and cooperative in NAD Head:  Normocephalic and atraumatic. Eyes:  Sclera clear, no icterus.   Conjunctiva pink. Ears:  Normal auditory acuity. Nose:  No deformity, discharge,  or lesions. Mouth:  No deformity or lesions, dentition normal. Neck:  Supple; no masses or thyromegaly. Lungs:  Clear throughout to auscultation.   No wheezes, crackles, or rhonchi. No acute distress. Heart:  Regular rate and rhythm; no murmurs, clicks, rubs,  or gallops. Abdomen:  Soft, nontender and nondistended. No masses, hepatosplenomegaly or hernias noted. Normal bowel sounds, without guarding, and without rebound.   Msk:  Symmetrical without gross deformities. Normal posture. Pulses:  Normal pulses noted. Extremities:  Without clubbing or edema.   Impression/Plan: Linda Burnett is now here to undergo a screening colonoscopy.  First ever high risk screening examination.  Risks, benefits, limitations, imponderables and alternatives regarding colonoscopy have been reviewed with the patient. Questions have been answered. All parties agreeable.     Notice:  This dictation was prepared with Dragon dictation along with smaller phrase technology. Any transcriptional errors that result from this process are unintentional and may not be corrected upon review.

## 2016-04-08 ENCOUNTER — Encounter: Payer: Self-pay | Admitting: Internal Medicine

## 2016-04-09 ENCOUNTER — Encounter (HOSPITAL_COMMUNITY): Payer: Self-pay | Admitting: Internal Medicine

## 2016-06-14 ENCOUNTER — Encounter: Payer: Self-pay | Admitting: Internal Medicine

## 2016-06-14 ENCOUNTER — Ambulatory Visit (INDEPENDENT_AMBULATORY_CARE_PROVIDER_SITE_OTHER): Payer: BLUE CROSS/BLUE SHIELD | Admitting: Internal Medicine

## 2016-06-14 VITALS — BP 114/77 | HR 59 | Temp 98.3°F | Ht 65.0 in | Wt 233.0 lb

## 2016-06-14 DIAGNOSIS — R1011 Right upper quadrant pain: Secondary | ICD-10-CM | POA: Diagnosis not present

## 2016-06-14 DIAGNOSIS — K219 Gastro-esophageal reflux disease without esophagitis: Secondary | ICD-10-CM | POA: Diagnosis not present

## 2016-06-14 NOTE — Patient Instructions (Signed)
We will retrieve records from [facility] pertaining to [name] may need an upper endoscopy  Further recommendations to follow

## 2016-06-14 NOTE — Progress Notes (Signed)
Primary Care Physician:  BURNS, Chrystine Oiler, MD Primary Gastroenterologist:  Dr. Gala Romney  Pre-Procedure History & Physical: HPI:  Linda Burnett is a 30 y.o. female here for evaluation of intermittent epigastric/ right upper quadrant pain and bloating. Occurs primarily postprandially and will last a couple hours and then subsides.   Symptoms worsening recently. Describes going to [facility] ER 6 months ago and had diagnostic studies done on her gallbladder. She doesn't recall specific testing. States they "could not find anything". Has GERD symptoms almost every day -  had GERD for many years. No dysphagia or odynophagia. Hasn't had any melena or change in bowel function. No fever or chills. No yellow jaundice. Just recently had a colonoscopy by me where she was found to have tubular adenomas( Positive family history colon cancer). She is due for surveillance colonoscopy in 5 years.  Past Medical History  Diagnosis Date  . Polycystic ovary syndrome   . Prediabetes   . Tubular adenoma 2017    Past Surgical History  Procedure Laterality Date  . Tonsillectomy    . Dental surgery    . Colonoscopy N/A 04/05/2016    Dr.Nandi Tonnesen- 2-84mm polyps at the hepatic flexure, one 53mm polyp in the descending colon, the examined portion of the ileum was normal, the examination was o/w normal on direct and retroflexion views. bx= tubular adenoma and hyperplastic polyp.    Prior to Admission medications   Medication Sig Start Date End Date Taking? Authorizing Provider  polyethylene glycol-electrolytes (TRILYTE) 420 g solution Take 4,000 mLs by mouth as directed. 03/18/16   Daneil Dolin, MD    Allergies as of 06/14/2016  . (No Known Allergies)    Family History  Problem Relation Age of Onset  . Colon cancer Father   . Diabetes Maternal Grandmother   . Diabetes Paternal Grandmother   . Colon cancer Paternal Grandmother   . Heart attack Maternal Uncle     Died age 34     Social History   Social  History  . Marital Status: Married    Spouse Name: N/A  . Number of Children: N/A  . Years of Education: N/A   Occupational History  . Not on file.   Social History Main Topics  . Smoking status: Former Smoker    Types: Cigarettes    Quit date: 08/21/2015  . Smokeless tobacco: Not on file  . Alcohol Use: No  . Drug Use: No  . Sexual Activity: Yes   Other Topics Concern  . Not on file   Social History Narrative    Review of Systems: See HPI, otherwise negative ROS  Physical Exam: There were no vitals taken for this visit. General:   Alert,  Well-developed, well-nourished, pleasant and cooperative in NAD Skin:  Intact without significant lesions or rashes. Neck:  Supple; no masses or thyromegaly. No significant cervical adenopathy. Lungs:  Clear throughout to auscultation.   No wheezes, crackles, or rhonchi. No acute distress. Heart:  Regular rate and rhythm; no murmurs, clicks, rubs,  or gallops. Abdomen: Non-distended, normal bowel sounds.  Soft and nontender without appreciable mass or hepatosplenomegaly.  Pulses:  Normal pulses noted. Extremities:  Without clubbing or edema.  Impression:   Pleasant obese 30 year old lady with chronic GERD on no acid suppression therapy presenting with intermittent insidiously worsening epigastric/ right upper quadrant abdominal pain and some bloating after meals.  Symptoms may last a few hours after eating and then subside. History of asymptomatic kidney stone previously. She does not  have any bowel symptoms otherwise. Recent colonoscopy as outlined.  Symptoms have a biliary flavor. She describes having diagnostic studies at [facility] of which I have for review at this time. She has significant GERD symptoms. I suspect she has a separate process, Most likely gallbladder in origin   Recommendations: We'll retrieve results of diagnostic studies done at [facility] as soon as possible. Will review. May need EGD as discussed with the patient  in the near future to help sort out.  In fact, would likely need an EGD to evaluate chronic GERD anyway. Further recommendations to follow.     Notice: This dictation was prepared with Dragon dictation along with smaller phrase technology. Any transcriptional errors that result from this process are unintentional and may not be corrected upon review.

## 2016-06-15 DIAGNOSIS — K219 Gastro-esophageal reflux disease without esophagitis: Secondary | ICD-10-CM | POA: Insufficient documentation

## 2016-06-15 DIAGNOSIS — R1011 Right upper quadrant pain: Secondary | ICD-10-CM | POA: Insufficient documentation

## 2017-11-12 ENCOUNTER — Encounter (HOSPITAL_COMMUNITY): Payer: Self-pay | Admitting: *Deleted

## 2017-11-12 ENCOUNTER — Emergency Department (HOSPITAL_COMMUNITY)
Admission: EM | Admit: 2017-11-12 | Discharge: 2017-11-12 | Disposition: A | Discharge status: Home / Self Care | Payer: BLUE CROSS/BLUE SHIELD | Attending: Emergency Medicine | Admitting: Emergency Medicine

## 2017-11-12 ENCOUNTER — Emergency Department (HOSPITAL_COMMUNITY): Payer: BLUE CROSS/BLUE SHIELD

## 2017-11-12 ENCOUNTER — Other Ambulatory Visit: Payer: Self-pay

## 2017-11-12 DIAGNOSIS — Z87891 Personal history of nicotine dependence: Secondary | ICD-10-CM | POA: Diagnosis not present

## 2017-11-12 DIAGNOSIS — R112 Nausea with vomiting, unspecified: Secondary | ICD-10-CM | POA: Diagnosis not present

## 2017-11-12 DIAGNOSIS — R1011 Right upper quadrant pain: Secondary | ICD-10-CM | POA: Diagnosis present

## 2017-11-12 DIAGNOSIS — R1010 Upper abdominal pain, unspecified: Secondary | ICD-10-CM

## 2017-11-12 LAB — COMPREHENSIVE METABOLIC PANEL
ALBUMIN: 3.9 g/dL (ref 3.5–5.0)
ALK PHOS: 59 U/L (ref 38–126)
ALT: 27 U/L (ref 14–54)
AST: 15 U/L (ref 15–41)
Anion gap: 7 (ref 5–15)
BUN: 10 mg/dL (ref 6–20)
CALCIUM: 9.1 mg/dL (ref 8.9–10.3)
CO2: 26 mmol/L (ref 22–32)
CREATININE: 0.68 mg/dL (ref 0.44–1.00)
Chloride: 103 mmol/L (ref 101–111)
GFR calc Af Amer: >60 mL/min (ref >60)
GFR calc non Af Amer: >60 mL/min (ref >60)
GLUCOSE: 109 mg/dL — AB (ref 65–99)
Potassium: 4.4 mmol/L (ref 3.5–5.1)
SODIUM: 136 mmol/L (ref 135–145)
Total Bilirubin: 0.6 mg/dL (ref 0.3–1.2)
Total Protein: 7.2 g/dL (ref 6.5–8.1)

## 2017-11-12 LAB — CBC WITH DIFFERENTIAL/PLATELET
Basophils Absolute: 0 10*3/uL (ref 0.0–0.1)
Basophils Relative: 1 %
EOS ABS: 0.1 10*3/uL (ref 0.0–0.7)
EOS PCT: 1 %
HCT: 40.8 % (ref 36.0–46.0)
HEMOGLOBIN: 13.4 g/dL (ref 12.0–15.0)
LYMPHS ABS: 2.6 10*3/uL (ref 0.7–4.0)
Lymphocytes Relative: 35 %
MCH: 28.9 pg (ref 26.0–34.0)
MCHC: 32.8 g/dL (ref 30.0–36.0)
MCV: 88.1 fL (ref 78.0–100.0)
MONOS PCT: 5 %
Monocytes Absolute: 0.4 10*3/uL (ref 0.1–1.0)
NEUTROS PCT: 58 %
Neutro Abs: 4.3 10*3/uL (ref 1.7–7.7)
Platelets: 310 10*3/uL (ref 150–400)
RBC: 4.63 MIL/uL (ref 3.87–5.11)
RDW: 13.9 % (ref 11.5–15.5)
WBC: 7.5 10*3/uL (ref 4.0–10.5)

## 2017-11-12 LAB — URINALYSIS, ROUTINE W REFLEX MICROSCOPIC
Bilirubin Urine: NEGATIVE
GLUCOSE, UA: NEGATIVE mg/dL
Hgb urine dipstick: NEGATIVE
Ketones, ur: NEGATIVE mg/dL
LEUKOCYTES UA: NEGATIVE
NITRITE: NEGATIVE
Protein, ur: NEGATIVE mg/dL
Specific Gravity, Urine: 1.015 (ref 1.005–1.030)
pH: 7 (ref 5.0–8.0)

## 2017-11-12 LAB — LIPASE, BLOOD: Lipase: 25 U/L (ref 11–51)

## 2017-11-12 LAB — PREGNANCY, URINE: Preg Test, Ur: NEGATIVE

## 2017-11-12 MED ORDER — ONDANSETRON 4 MG PO TBDP: 4.0000 mg | ORAL_TABLET | Freq: Three times a day (TID) | ORAL | 0 refills | Status: DC | PRN

## 2017-11-12 MED ORDER — ONDANSETRON HCL 4 MG/2ML IJ SOLN
4.0000 mg | INTRAMUSCULAR | Status: DC | PRN
  Administered 2017-11-12: 4 mg via INTRAVENOUS
  Filled 2017-11-12: qty 2

## 2017-11-12 MED ORDER — FAMOTIDINE 20 MG PO TABS: 20.0000 mg | ORAL_TABLET | Freq: Two times a day (BID) | ORAL | 0 refills | Status: DC

## 2017-11-12 MED ORDER — MORPHINE SULFATE (PF) 4 MG/ML IV SOLN
4.0000 mg | INTRAVENOUS | Status: DC | PRN
  Administered 2017-11-12: 4 mg via INTRAVENOUS
  Filled 2017-11-12: qty 1

## 2017-11-12 MED ORDER — FAMOTIDINE IN NACL 20-0.9 MG/50ML-% IV SOLN
20.0000 mg | Freq: Once | INTRAVENOUS | Status: AC
  Administered 2017-11-12: 20 mg via INTRAVENOUS
  Filled 2017-11-12: qty 50

## 2017-11-12 NOTE — ED Provider Notes (Signed)
[facility] EMERGENCY DEPARTMENT Provider Note   CSN: [id number] Arrival date & time: 11/12/17  7341     History   Chief Complaint No chief complaint on file.   HPI Linda Burnett is a 31 y.o. female.  HPI  Pt was seen at 0735.  Per pt, c/o gradual onset and persistence of constant RUQ abd "pain" for the past 3 days.  Has been associated with multiple intermittent episodes of N/V.  Describes the abd pain as "dull" and "I think it's my gallbladder."  Pain worsened after she ate lasagna. Denies diarrhea, no fevers, no back pain, no rash, no CP/SOB, no black or blood in stools or emesis.      Past Medical History:  Diagnosis Date  . Polycystic ovary syndrome   . Prediabetes   . Tubular adenoma 2017    Patient Active Problem List   Diagnosis Date Noted  . GERD (gastroesophageal reflux disease) 06/15/2016  . Abdominal pain, right upper quadrant 06/15/2016  . History of colonic polyps   . Anovulation 08/29/2014  . Absence of menstruation 03/08/2014    Past Surgical History:  Procedure Laterality Date  . DENTAL SURGERY    . TONSILLECTOMY      OB History    No data available       Home Medications    Prior to Admission medications   Medication Sig Start Date End Date Taking? Authorizing Provider  polyethylene glycol-electrolytes (TRILYTE) 420 g solution Take 4,000 mLs by mouth as directed. Patient not taking: Reported on 06/14/2016 03/18/16   Rourk, Cristopher Estimable, MD    Family History Family History  Problem Relation Age of Onset  . Colon cancer Father   . Diabetes Maternal Grandmother   . Diabetes Paternal Grandmother   . Colon cancer Paternal Grandmother   . Heart attack Maternal Uncle        Died age 30     Social History Social History   Tobacco Use  . Smoking status: Former Smoker    Types: Cigarettes    Last attempt to quit: 08/21/2015    Years since quitting: 2.2  Substance Use Topics  . Alcohol use: No    Alcohol/week: 0.0 oz  . Drug use: No      Allergies   Patient has no known allergies.   Review of Systems Review of Systems ROS: Statement: All systems negative except as marked or noted in the HPI; Constitutional: Negative for fever and chills. ; ; Eyes: Negative for eye pain, redness and discharge. ; ; ENMT: Negative for ear pain, hoarseness, nasal congestion, sinus pressure and sore throat. ; ; Cardiovascular: Negative for chest pain, palpitations, diaphoresis, dyspnea and peripheral edema. ; ; Respiratory: Negative for cough, wheezing and stridor. ; ; Gastrointestinal: +N/V, abd pain. Negative for diarrhea, blood in stool, hematemesis, jaundice and rectal bleeding. . ; ; Genitourinary: Negative for dysuria, flank pain and hematuria. ; ; Musculoskeletal: Negative for back pain and neck pain. Negative for swelling and trauma.; ; Skin: Negative for pruritus, rash, abrasions, blisters, bruising and skin lesion.; ; Neuro: Negative for headache, lightheadedness and neck stiffness. Negative for weakness, altered level of consciousness, altered mental status, extremity weakness, paresthesias, involuntary movement, seizure and syncope.       Physical Exam Updated Vital Signs BP 119/72 (BP Location: Right Arm)   Pulse 65   Temp 98.2 F (36.8 C) (Oral)   Resp 20   Ht 5\' 5"  (1.651 m)   Wt 104.3 kg (230 lb)  SpO2 97%   BMI 38.27 kg/m   Physical Exam 0740: Physical examination:  Nursing notes reviewed; Vital signs and O2 SAT reviewed;  Constitutional: Well developed, Well nourished, Well hydrated, In no acute distress; Head:  Normocephalic, atraumatic; Eyes: EOMI, PERRL, No scleral icterus; ENMT: Mouth and pharynx normal, Mucous membranes moist; Neck: Supple, Full range of motion, No lymphadenopathy; Cardiovascular: Regular rate and rhythm, No gallop; Respiratory: Breath sounds clear & equal bilaterally, No wheezes.  Speaking full sentences with ease, Normal respiratory effort/excursion; Chest: Nontender, Movement normal; Abdomen:  Soft, +RUQ and mid-epigastric tenderness to palp. No rebound or guarding. Nondistended, Normal bowel sounds; Genitourinary: No CVA tenderness; Extremities: Pulses normal, No tenderness, No edema, No calf edema or asymmetry.; Neuro: AA&Ox3, Major CN grossly intact.  Speech clear. No gross focal motor or sensory deficits in extremities.; Skin: Color normal, Warm, Dry.   ED Treatments / Results  Labs (all labs ordered are listed, but only abnormal results are displayed)   EKG  EKG Interpretation None       Radiology No results found.  Procedures Procedures (including critical care time)  Medications Ordered in ED Medications  famotidine (PEPCID) IVPB 20 mg premix (not administered)  ondansetron (ZOFRAN) injection 4 mg (not administered)  morphine 4 MG/ML injection 4 mg (not administered)     Initial Impression / Assessment and Plan / ED Course  I have reviewed the triage vital signs and the nursing notes.  Pertinent labs & imaging results that were available during my care of the patient were reviewed by me and considered in my medical decision making (see chart for details).  MDM Reviewed: previous chart, nursing note and vitals Reviewed previous: labs Interpretation: labs, x-ray and ultrasound   Results for orders placed or performed during the hospital encounter of 11/12/17  Urinalysis, Routine w reflex microscopic  Result Value Ref Range   Color, Urine YELLOW YELLOW   APPearance HAZY (A) CLEAR   Specific Gravity, Urine 1.015 1.005 - 1.030   pH 7.0 5.0 - 8.0   Glucose, UA NEGATIVE NEGATIVE mg/dL   Hgb urine dipstick NEGATIVE NEGATIVE   Bilirubin Urine NEGATIVE NEGATIVE   Ketones, ur NEGATIVE NEGATIVE mg/dL   Protein, ur NEGATIVE NEGATIVE mg/dL   Nitrite NEGATIVE NEGATIVE   Leukocytes, UA NEGATIVE NEGATIVE  Pregnancy, urine  Result Value Ref Range   Preg Test, Ur NEGATIVE NEGATIVE  Comprehensive metabolic panel  Result Value Ref Range   Sodium 136 135 - 145  mmol/L   Potassium 4.4 3.5 - 5.1 mmol/L   Chloride 103 101 - 111 mmol/L   CO2 26 22 - 32 mmol/L   Glucose, Bld 109 (H) 65 - 99 mg/dL   BUN 10 6 - 20 mg/dL   Creatinine, Ser 0.68 0.44 - 1.00 mg/dL   Calcium 9.1 8.9 - 10.3 mg/dL   Total Protein 7.2 6.5 - 8.1 g/dL   Albumin 3.9 3.5 - 5.0 g/dL   AST 15 15 - 41 U/L   ALT 27 14 - 54 U/L   Alkaline Phosphatase 59 38 - 126 U/L   Total Bilirubin 0.6 0.3 - 1.2 mg/dL   GFR calc non Af Amer >60 >60 mL/min   GFR calc Af Amer >60 >60 mL/min   Anion gap 7 5 - 15  Lipase, blood  Result Value Ref Range   Lipase 25 11 - 51 U/L  CBC with Differential  Result Value Ref Range   WBC 7.5 4.0 - 10.5 K/uL   RBC 4.63 3.87 -  5.11 MIL/uL   Hemoglobin 13.4 12.0 - 15.0 g/dL   HCT 40.8 36.0 - 46.0 %   MCV 88.1 78.0 - 100.0 fL   MCH 28.9 26.0 - 34.0 pg   MCHC 32.8 30.0 - 36.0 g/dL   RDW 13.9 11.5 - 15.5 %   Platelets 310 150 - 400 K/uL   Neutrophils Relative % 58 %   Neutro Abs 4.3 1.7 - 7.7 K/uL   Lymphocytes Relative 35 %   Lymphs Abs 2.6 0.7 - 4.0 K/uL   Monocytes Relative 5 %   Monocytes Absolute 0.4 0.1 - 1.0 K/uL   Eosinophils Relative 1 %   Eosinophils Absolute 0.1 0.0 - 0.7 K/uL   Basophils Relative 1 %   Basophils Absolute 0.0 0.0 - 0.1 K/uL   Dg Chest 2 View Result Date: 11/12/2017 CLINICAL DATA:  Pain and vomiting EXAM: CHEST  2 VIEW COMPARISON:  Chest radiograph October 12, 2006 and chest CT October 23, 2010 FINDINGS: There is no edema or consolidation. Heart size and pulmonary vascularity are normal. No adenopathy. No pneumothorax. No bone lesions. IMPRESSION: No edema or consolidation. Electronically Signed   By: Lowella Grip III M.D.   On: 11/12/2017 08:29   US Abdomen Complete Result Date: 11/12/2017 CLINICAL DATA:  Upper abdominal pain with nausea and vomiting EXAM: ABDOMEN ULTRASOUND COMPLETE COMPARISON:  September 21, 2013 CT abdomen and pelvis FINDINGS: Gallbladder: No gallstones or wall thickening visualized. There is no  pericholecystic fluid. No sonographic Murphy sign noted by sonographer. Common bile duct: Diameter: 3 mm. No intrahepatic, common hepatic, or common bile duct dilatation. Liver: No focal lesion identified. Liver echogenicity is overall increased. Portal vein is patent on color Doppler imaging with normal direction of blood flow towards the liver. IVC: No abnormality visualized. Pancreas: Visualized portion unremarkable. Portions of pancreas obscured by gas. Spleen: Size and appearance within normal limits. Right Kidney: Length: 12.4 cm. Echogenicity within normal limits. No mass or hydronephrosis visualized. Left Kidney: Length: 12.0 cm. Echogenicity within normal limits. No mass or hydronephrosis visualized. Abdominal aorta: No aneurysm visualized. Other findings: No demonstrable ascites. IMPRESSION: 1. Increased liver echogenicity, a finding most likely indicative of hepatic steatosis. While no focal liver lesions are evident on this study, it must be cautioned that the sensitivity of ultrasound for detection of focal liver lesions is diminished in this circumstance. 2. Portions of pancreas obscured by gas. Visualized portions of pancreas appear normal. 3.  Study otherwise unremarkable. Electronically Signed   By: Lowella Grip III M.D.   On: 11/12/2017 08:28    0905:  Pt has tol PO well while in the ED without N/V.  No stooling while in the ED.  Abd benign, VSS. Feels better and wants to go home now. Tx symptomatically at this time. Pt requesting a work note for today and tomorrow. Dx and testing d/w pt and family.  Questions answered.  Verb understanding, agreeable to d/c home with outpt f/u.     Final Clinical Impressions(s) / ED Diagnoses   Final diagnoses:  None    ED Discharge Orders    None       Francine Graven, DO 11/14/17 1420

## 2017-11-12 NOTE — Discharge Instructions (Signed)
Eat a bland diet, avoiding greasy, fatty, fried foods, as well as spicy and acidic foods or beverages.  Avoid eating within 2 to 3 hours before going to bed or laying down.  Also avoid teas, colas, coffee, chocolate, pepermint and spearment.  May also take over the counter maalox/mylanta, as directed on packaging, as needed for discomfort.  Take the prescriptions as directed.  Call your regular medical doctor today to schedule a follow up appointment this week.  Call the GI doctor and the General Surgeon today to schedule a follow up appointment within the next week. Return to the Emergency Department immediately if worsening.

## 2017-11-12 NOTE — ED Triage Notes (Signed)
Nausea vomiting diarrhea x 3-4 days.  States "I think its my gallbladder" pt still having pain in right upper quad

## 2019-08-12 LAB — OB RESULTS CONSOLE GC/CHLAMYDIA
Chlamydia: NEGATIVE
Gonorrhea: NEGATIVE

## 2019-08-12 LAB — OB RESULTS CONSOLE ANTIBODY SCREEN: Antibody Screen: NEGATIVE

## 2019-08-12 LAB — OB RESULTS CONSOLE HIV ANTIBODY (ROUTINE TESTING): HIV: NONREACTIVE

## 2019-08-12 LAB — OB RESULTS CONSOLE HEPATITIS B SURFACE ANTIGEN: Hepatitis B Surface Ag: NEGATIVE

## 2019-08-12 LAB — OB RESULTS CONSOLE ABO/RH: RH Type: POSITIVE

## 2019-08-12 LAB — OB RESULTS CONSOLE RUBELLA ANTIBODY, IGM: Rubella: IMMUNE

## 2019-08-12 LAB — OB RESULTS CONSOLE RPR: RPR: NONREACTIVE

## 2020-01-26 ENCOUNTER — Inpatient Hospital Stay (HOSPITAL_COMMUNITY)
Admission: AD | Admit: 2020-01-26 | Discharge: 2020-01-27 | Disposition: A | Discharge status: Home / Self Care | Payer: 59 | Source: Ambulatory Visit | Attending: Obstetrics and Gynecology | Admitting: Obstetrics and Gynecology

## 2020-01-26 ENCOUNTER — Other Ambulatory Visit: Payer: Self-pay

## 2020-01-26 ENCOUNTER — Encounter (HOSPITAL_COMMUNITY): Payer: Self-pay | Admitting: Obstetrics and Gynecology

## 2020-01-26 DIAGNOSIS — M545 Low back pain, unspecified: Secondary | ICD-10-CM

## 2020-01-26 DIAGNOSIS — Z3A33 33 weeks gestation of pregnancy: Secondary | ICD-10-CM | POA: Diagnosis not present

## 2020-01-26 DIAGNOSIS — R109 Unspecified abdominal pain: Secondary | ICD-10-CM | POA: Diagnosis present

## 2020-01-26 DIAGNOSIS — R82998 Other abnormal findings in urine: Secondary | ICD-10-CM

## 2020-01-26 DIAGNOSIS — Z87891 Personal history of nicotine dependence: Secondary | ICD-10-CM | POA: Insufficient documentation

## 2020-01-26 DIAGNOSIS — O4700 False labor before 37 completed weeks of gestation, unspecified trimester: Secondary | ICD-10-CM

## 2020-01-26 DIAGNOSIS — O479 False labor, unspecified: Secondary | ICD-10-CM

## 2020-01-26 DIAGNOSIS — O26893 Other specified pregnancy related conditions, third trimester: Secondary | ICD-10-CM

## 2020-01-26 DIAGNOSIS — R319 Hematuria, unspecified: Secondary | ICD-10-CM

## 2020-01-26 HISTORY — DX: Calculus of kidney: N20.0

## 2020-01-26 NOTE — MAU Note (Signed)
Lower Back pain wrapped around to lower abd x 2.5 hours and on way her it eased up.  Pain was constant for hour but eased.  No bleeding. No leaking. Baby moving well.

## 2020-01-26 NOTE — MAU Provider Note (Signed)
Chief Complaint:  Back Pain and Abdominal Pain   First Provider Initiated Contact with Patient 01/26/20 2345     HPI: Linda Burnett is a 34 y.o. G1P0 at 22w6dwho presents to maternity admissions reporting lower abdominal and low back pain for several hours.  Is better now but still present. . She reports good fetal movement, denies LOF, vaginal bleeding, vaginal itching/burning, urinary symptoms, h/a, dizziness, n/v, diarrhea, constipation or fever/chills.    Back Pain This is a new problem. The current episode started today. The problem occurs intermittently. The problem has been gradually improving since onset. The pain is present in the lumbar spine. The quality of the pain is described as aching and cramping. The pain does not radiate. Associated symptoms include abdominal pain. Pertinent negatives include no dysuria, fever, numbness or weakness. She has tried nothing for the symptoms.  Abdominal Pain This is a new problem. The current episode started today. The onset quality is gradual. The problem occurs intermittently. The problem has been gradually improving. The quality of the pain is cramping. The abdominal pain radiates to the back. Pertinent negatives include no dysuria or fever. Nothing aggravates the pain. The pain is relieved by nothing. She has tried nothing for the symptoms.    RN Note: Lower Back pain wrapped around to lower abd x 2.5 hours and on way her it eased up.  Pain was constant for hour but eased.  No bleeding. No leaking. Baby moving well.   Past Medical History: Past Medical History:  Diagnosis Date  . Medical history non-contributory   . Polycystic ovary syndrome   . Prediabetes   . Tubular adenoma 2017    Past obstetric history: OB History  Gravida Para Term Preterm AB Living  1            SAB TAB Ectopic Multiple Live Births               # Outcome Date GA Lbr Len/2nd Weight Sex Delivery Anes PTL Lv  1 Current             Past Surgical  History: Past Surgical History:  Procedure Laterality Date  . COLONOSCOPY N/A 04/05/2016   Dr.Rourk- 2-55mm polyps at the hepatic flexure, one 8mm polyp in the descending colon, the examined portion of the ileum was normal, the examination was o/w normal on direct and retroflexion views. bx= tubular adenoma and hyperplastic polyp.  . DENTAL SURGERY    . TONSILLECTOMY    . UTERINE SEPTUM RESECTION     "took a uterine septum out before IVF"    Family History: Family History  Problem Relation Age of Onset  . Colon cancer Father   . Diabetes Maternal Grandmother   . Diabetes Paternal Grandmother   . Colon cancer Paternal Grandmother   . Heart attack Maternal Uncle        Died age 6     Social History: Social History   Tobacco Use  . Smoking status: Former Smoker    Types: Cigarettes    Quit date: 08/21/2015    Years since quitting: 4.4  . Smokeless tobacco: Never Used  Substance Use Topics  . Alcohol use: No    Alcohol/week: 0.0 standard drinks  . Drug use: No    Allergies: No Known Allergies  Meds:  Medications Prior to Admission  Medication Sig Dispense Refill Last Dose  . Prenatal Vit-Fe Fumarate-FA (PRENATAL MULTIVITAMIN) TABS tablet Take 1 tablet by mouth daily at 12 noon.   01/26/2020  at Unknown time  . famotidine (PEPCID) 20 MG tablet Take 1 tablet (20 mg total) 2 (two) times daily by mouth. 30 tablet 0   . ondansetron (ZOFRAN ODT) 4 MG disintegrating tablet Take 1 tablet (4 mg total) every 8 (eight) hours as needed by mouth for nausea or vomiting. 6 tablet 0   . polyethylene glycol-electrolytes (TRILYTE) 420 g solution Take 4,000 mLs by mouth as directed. (Patient not taking: Reported on 06/14/2016) 4000 mL 0     I have reviewed patient's Past Medical Hx, Surgical Hx, Family Hx, Social Hx, medications and allergies.   ROS:  Review of Systems  Constitutional: Negative for fever.  Gastrointestinal: Positive for abdominal pain.  Genitourinary: Negative for dysuria.   Musculoskeletal: Positive for back pain.  Neurological: Negative for weakness and numbness.   Other systems negative  Physical Exam   Patient Vitals for the past 24 hrs:  BP Temp Temp src Pulse Resp SpO2 Height Weight  01/26/20 2314 -- -- -- -- -- -- 5\' 5"  (1.651 m) 106.6 kg  01/26/20 2313 125/86 -- -- 99 -- -- -- --  01/26/20 2310 -- -- -- -- -- 97 % -- --  01/26/20 2305 -- 98.7 F (37.1 C) Oral -- 16 99 % -- --   Constitutional: Well-developed, well-nourished female in no acute distress.  Cardiovascular: normal rate and rhythm Respiratory: normal effort, clear to auscultation bilaterally GI: Abd soft, non-tender, gravid appropriate for gestational age.   No rebound or guarding. MS: Extremities nontender, no edema, normal ROM Neurologic: Alert and oriented x 4.  GU: Neg CVAT.  PELVIC EXAM: Dilation: Closed Effacement (%): 20 Exam by:: Cindel Daugherty cnm   FHT:  Baseline 140 , moderate variability, accelerations present, no decelerations Contractions: q 2-4 mins Irregular     Labs: Results for orders placed or performed during the hospital encounter of 01/26/20 (from the past 24 hour(s))  Fetal fibronectin     Status: None   Collection Time: 01/26/20 11:44 PM  Result Value Ref Range   Fetal Fibronectin NEGATIVE NEGATIVE  Urinalysis, Routine w reflex microscopic     Status: Abnormal   Collection Time: 01/26/20 11:57 PM  Result Value Ref Range   Color, Urine YELLOW YELLOW   APPearance CLOUDY (A) CLEAR   Specific Gravity, Urine 1.012 1.005 - 1.030   pH 6.0 5.0 - 8.0   Glucose, UA 50 (A) NEGATIVE mg/dL   Hgb urine dipstick LARGE (A) NEGATIVE   Bilirubin Urine NEGATIVE NEGATIVE   Ketones, ur 20 (A) NEGATIVE mg/dL   Protein, ur 100 (A) NEGATIVE mg/dL   Nitrite NEGATIVE NEGATIVE   Leukocytes,Ua TRACE (A) NEGATIVE   RBC / HPF >50 (H) 0 - 5 RBC/hpf   WBC, UA 21-50 0 - 5 WBC/hpf   Bacteria, UA MANY (A) NONE SEEN   Squamous Epithelial / LPF 0-5 0 - 5   WBC Clumps PRESENT     Mucus PRESENT    Hyaline Casts, UA PRESENT    Ca Oxalate Crys, UA PRESENT      Imaging:  No results found.  MAU Course/MDM: I have ordered labs and reviewed results.  Fetal fibronectin was negative. Urine is suggestive of infection, Will send it to culture and treat presumptively NST reviewed, reactive   Treatments in MAU included Procardia series x 3 doses with good resolution of contractions.  Discussed negative FFn is reassuring  Assessment: Single intrauterine pregnancy at [redacted]w[redacted]d Preterm uterine contractions Low back pain Urine suspicious for infection  Plan:  Discharge home Rx Keflex for presumed UTI Urine to culture Preterm Labor precautions and fetal kick counts Follow up in Office for prenatal visits   Encouraged to return here or to other Urgent Care/ED if she develops worsening of symptoms, increase in pain, fever, or other concerning symptoms.  Pt stable at time of discharge.  Hansel Feinstein CNM, MSN Certified Nurse-Midwife 01/26/2020 11:45 PM

## 2020-01-27 LAB — CULTURE, OB URINE

## 2020-01-27 LAB — FETAL FIBRONECTIN: Fetal Fibronectin: NEGATIVE

## 2020-01-27 LAB — URINALYSIS, ROUTINE W REFLEX MICROSCOPIC
Bilirubin Urine: NEGATIVE
Glucose, UA: 50 mg/dL — AB
Ketones, ur: 20 mg/dL — AB
Nitrite: NEGATIVE
Protein, ur: 100 mg/dL — AB
RBC / HPF: >50 RBC/hpf — ABNORMAL HIGH (ref 0–5)
Specific Gravity, Urine: 1.012 (ref 1.005–1.030)
pH: 6 (ref 5.0–8.0)

## 2020-01-27 MED ORDER — CEPHALEXIN 500 MG PO CAPS: 500.0000 mg | ORAL_CAPSULE | Freq: Four times a day (QID) | ORAL | 2 refills | Status: DC

## 2020-01-27 MED ORDER — NIFEDIPINE 10 MG PO CAPS
10.0000 mg | ORAL_CAPSULE | ORAL | Status: DC | PRN
  Administered 2020-01-27 (×3): 10 mg via ORAL
  Filled 2020-01-27 (×3): qty 1

## 2020-01-27 NOTE — Progress Notes (Signed)
Marie Williams CNM in earlier to discuss d/c plan. Written and verbal d/c instructions given and understanding voiced. 

## 2020-01-27 NOTE — Discharge Instructions (Signed)
Hematuria, Adult Hematuria is blood in the urine. Blood may be visible in the urine, or it may be identified with a test. This condition can be caused by infections of the bladder, urethra, kidney, or prostate. Other possible causes include:  Kidney stones.  Cancer of the urinary tract.  Too much calcium in the urine.  Conditions that are passed from parent to child (inherited conditions).  Exercise that requires a lot of energy. Infections can usually be treated with medicine, and a kidney stone usually will pass through your urine. If neither of these is the cause of your hematuria, more tests may be needed to identify the cause of your symptoms. It is very important to tell your health care provider about any blood in your urine, even if it is painless or the blood stops without treatment. Blood in the urine, when it happens and then stops and then happens again, can be a symptom of a very serious condition, including cancer. There is no pain in the initial stages of many urinary cancers. Follow these instructions at home: Medicines  Take over-the-counter and prescription medicines only as told by your health care provider.  If you were prescribed an antibiotic medicine, take it as told by your health care provider. Do not stop taking the antibiotic even if you start to feel better. Eating and drinking  Drink enough fluid to keep your urine clear or pale yellow. It is recommended that you drink 3-4 quarts (2.8-3.8 L) a day. If you have been diagnosed with an infection, it is recommended that you drink cranberry juice in addition to large amounts of water.  Avoid caffeine, tea, and carbonated beverages. These tend to irritate the bladder.  Avoid alcohol because it may irritate the prostate (men). General instructions  If you have been diagnosed with a kidney stone, follow your health care provider's instructions about straining your urine to catch the stone.  Empty your bladder  often. Avoid holding urine for long periods of time.  If you are female: ? After a bowel movement, wipe from front to back and use each piece of toilet paper only once. ? Empty your bladder before and after sex.  Pay attention to any changes in your symptoms. Tell your health care provider about any changes or any new symptoms.  It is your responsibility to get your test results. Ask your health care provider, or the department performing the test, when your results will be ready.  Keep all follow-up visits as told by your health care provider. This is important. Contact a health care provider if:  You develop back pain.  You have a fever.  You have nausea or vomiting.  Your symptoms do not improve after 3 days.  Your symptoms get worse. Get help right away if:  You develop severe vomiting and are unable take medicine without vomiting.  You develop severe pain in your back or abdomen even though you are taking medicine.  You pass a large amount of blood in your urine.  You pass blood clots in your urine.  You feel very weak or like you might faint.  You faint. Summary  Hematuria is blood in the urine. It has many possible causes.  It is very important that you tell your health care provider about any blood in your urine, even if it is painless or the blood stops without treatment.  Take over-the-counter and prescription medicines only as told by your health care provider.  Drink enough fluid to keep   your urine clear or pale yellow. This information is not intended to replace advice given to you by your health care provider. Make sure you discuss any questions you have with your health care provider. Document Revised: 05/12/2019 Document Reviewed: 01/18/2017 Elsevier Patient Education  [place].   Back Pain in Pregnancy Back pain during pregnancy is common. Back pain may be caused by several factors that are related to changes during your pregnancy. Follow  these instructions at home: Managing pain, stiffness, and swelling      If directed, for sudden (acute) back pain, put ice on the painful area. ? Put ice in a plastic bag. ? Place a towel between your skin and the bag. ? Leave the ice on for 20 minutes, 2-3 times per day.  If directed, apply heat to the affected area before you exercise. Use the heat source that your health care provider recommends, such as a moist heat pack or a heating pad. ? Place a towel between your skin and the heat source. ? Leave the heat on for 20-30 minutes. ? Remove the heat if your skin turns bright red. This is especially important if you are unable to feel pain, heat, or cold. You may have a greater risk of getting burned.  If directed, massage the affected area. Activity  Exercise as told by your health care provider. Gentle exercise is the best way to prevent or manage back pain.  Listen to your body when lifting. If lifting hurts, ask for help or bend your knees. This uses your leg muscles instead of your back muscles.  Squat down when picking up something from the floor. Do not bend over.  Only use bed rest for short periods as told by your health care provider. Bed rest should only be used for the most severe episodes of back pain. Standing, sitting, and lying down  Do not stand in one place for long periods of time.  Use good posture when sitting. Make sure your head rests over your shoulders and is not hanging forward. Use a pillow on your lower back if necessary.  Try sleeping on your side, preferably the left side, with a pregnancy support pillow or 1-2 regular pillows between your legs. ? If you have back pain after a night's rest, your bed may be too soft. ? A firm mattress may provide more support for your back during pregnancy. General instructions  Do not wear high heels.  Eat a healthy diet. Try to gain weight within your health care provider's recommendations.  Use a maternity  girdle, elastic sling, or back brace as told by your health care provider.  Take over-the-counter and prescription medicines only as told by your health care provider.  Work with a physical therapist or massage therapist to find ways to manage back pain. Acupuncture or massage therapy may be helpful.  Keep all follow-up visits as told by your health care provider. This is important. Contact a health care provider if:  Your back pain interferes with your daily activities.  You have increasing pain in other parts of your body. Get help right away if:  You develop numbness, tingling, weakness, or problems with the use of your arms or legs.  You develop severe back pain that is not controlled with medicine.  You have a change in bowel or bladder control.  You develop shortness of breath, dizziness, or you faint.  You develop nausea, vomiting, or sweating.  You have back pain that is a  rhythmic, cramping pain similar to labor pains. Labor pain is usually 1-2 minutes apart, lasts for about 1 minute, and involves a bearing down feeling or pressure in your pelvis.  You have back pain and your water breaks or you have vaginal bleeding.  You have back pain or numbness that travels down your leg.  Your back pain developed after you fell.  You develop pain on one side of your back.  You see blood in your urine.  You develop skin blisters in the area of your back pain. Summary  Back pain may be caused by several factors that are related to changes during your pregnancy.  Follow instructions as told by your health care provider for managing pain, stiffness, and swelling.  Exercise as told by your health care provider. Gentle exercise is the best way to prevent or manage back pain.  Take over-the-counter and prescription medicines only as told by your health care provider.  Keep all follow-up visits as told by your health care provider. This is important. This information is not  intended to replace advice given to you by your health care provider. Make sure you discuss any questions you have with your health care provider. Document Revised: 04/06/2019 Document Reviewed: 06/03/2018 Elsevier Patient Education  [place].

## 2020-02-08 LAB — OB RESULTS CONSOLE GBS: GBS: POSITIVE

## 2020-02-24 ENCOUNTER — Encounter (HOSPITAL_COMMUNITY): Payer: Self-pay | Admitting: *Deleted

## 2020-02-24 ENCOUNTER — Telehealth (HOSPITAL_COMMUNITY): Payer: Self-pay | Admitting: *Deleted

## 2020-02-24 NOTE — Telephone Encounter (Signed)
Preadmission screen  

## 2020-02-25 ENCOUNTER — Telehealth (HOSPITAL_COMMUNITY): Payer: Self-pay | Admitting: *Deleted

## 2020-02-25 ENCOUNTER — Encounter (HOSPITAL_COMMUNITY): Payer: Self-pay | Admitting: *Deleted

## 2020-02-25 NOTE — Telephone Encounter (Signed)
Preadmission screen  

## 2020-03-02 ENCOUNTER — Other Ambulatory Visit: Payer: Self-pay | Admitting: Obstetrics and Gynecology

## 2020-03-06 ENCOUNTER — Other Ambulatory Visit (HOSPITAL_COMMUNITY)
Admission: RE | Admit: 2020-03-06 | Discharge: 2020-03-06 | Disposition: A | Discharge status: Home / Self Care | Payer: 59 | Source: Ambulatory Visit | Attending: Obstetrics and Gynecology | Admitting: Obstetrics and Gynecology

## 2020-03-06 LAB — SARS CORONAVIRUS 2 (TAT 6-24 HRS): SARS Coronavirus 2: NEGATIVE

## 2020-03-08 ENCOUNTER — Other Ambulatory Visit: Payer: Self-pay

## 2020-03-08 ENCOUNTER — Encounter (HOSPITAL_COMMUNITY): Payer: Self-pay | Admitting: Obstetrics and Gynecology

## 2020-03-08 ENCOUNTER — Inpatient Hospital Stay (HOSPITAL_COMMUNITY): Payer: 59 | Admitting: Anesthesiology

## 2020-03-08 ENCOUNTER — Inpatient Hospital Stay (HOSPITAL_COMMUNITY)
Admission: AD | Admit: 2020-03-08 | Discharge: 2020-03-11 | DRG: 787 | Disposition: A | Discharge status: Home / Self Care | Payer: 59 | Attending: Obstetrics and Gynecology | Admitting: Obstetrics and Gynecology

## 2020-03-08 ENCOUNTER — Encounter (HOSPITAL_COMMUNITY)
Admission: AD | Disposition: A | Discharge status: Home / Self Care | Payer: Self-pay | Source: Home / Self Care | Attending: Obstetrics and Gynecology

## 2020-03-08 ENCOUNTER — Inpatient Hospital Stay (HOSPITAL_COMMUNITY): Payer: 59

## 2020-03-08 DIAGNOSIS — O339 Maternal care for disproportion, unspecified: Secondary | ICD-10-CM | POA: Diagnosis present

## 2020-03-08 DIAGNOSIS — Z20822 Contact with and (suspected) exposure to covid-19: Secondary | ICD-10-CM | POA: Diagnosis present

## 2020-03-08 DIAGNOSIS — O3663X0 Maternal care for excessive fetal growth, third trimester, not applicable or unspecified: Principal | ICD-10-CM | POA: Diagnosis present

## 2020-03-08 DIAGNOSIS — Z3A39 39 weeks gestation of pregnancy: Secondary | ICD-10-CM | POA: Diagnosis not present

## 2020-03-08 DIAGNOSIS — O9081 Anemia of the puerperium: Secondary | ICD-10-CM | POA: Diagnosis not present

## 2020-03-08 DIAGNOSIS — Z87891 Personal history of nicotine dependence: Secondary | ICD-10-CM | POA: Diagnosis not present

## 2020-03-08 DIAGNOSIS — Z87442 Personal history of urinary calculi: Secondary | ICD-10-CM | POA: Diagnosis not present

## 2020-03-08 DIAGNOSIS — O99214 Obesity complicating childbirth: Secondary | ICD-10-CM | POA: Diagnosis present

## 2020-03-08 DIAGNOSIS — O99824 Streptococcus B carrier state complicating childbirth: Secondary | ICD-10-CM | POA: Diagnosis present

## 2020-03-08 DIAGNOSIS — D62 Acute posthemorrhagic anemia: Secondary | ICD-10-CM | POA: Diagnosis not present

## 2020-03-08 DIAGNOSIS — O26893 Other specified pregnancy related conditions, third trimester: Secondary | ICD-10-CM | POA: Diagnosis present

## 2020-03-08 DIAGNOSIS — Z3490 Encounter for supervision of normal pregnancy, unspecified, unspecified trimester: Secondary | ICD-10-CM | POA: Diagnosis present

## 2020-03-08 LAB — CBC
HCT: 32.5 % — ABNORMAL LOW (ref 36.0–46.0)
Hemoglobin: 10.2 g/dL — ABNORMAL LOW (ref 12.0–15.0)
MCH: 26.7 pg (ref 26.0–34.0)
MCHC: 31.4 g/dL (ref 30.0–36.0)
MCV: 85.1 fL (ref 80.0–100.0)
Platelets: 283 10*3/uL (ref 150–400)
RBC: 3.82 MIL/uL — ABNORMAL LOW (ref 3.87–5.11)
RDW: 14.1 % (ref 11.5–15.5)
WBC: 7.9 10*3/uL (ref 4.0–10.5)
nRBC: 0 % (ref 0.0–0.2)

## 2020-03-08 LAB — ABO/RH: ABO/RH(D): O POS

## 2020-03-08 LAB — TYPE AND SCREEN
ABO/RH(D): O POS
Antibody Screen: NEGATIVE

## 2020-03-08 LAB — RPR: RPR Ser Ql: NONREACTIVE

## 2020-03-08 SURGERY — Surgical Case
Anesthesia: Epidural | Wound class: Clean Contaminated

## 2020-03-08 MED ORDER — ACETAMINOPHEN 160 MG/5ML PO SOLN: 1000.0000 mg | Freq: Once | ORAL | Status: DC

## 2020-03-08 MED ORDER — KETOROLAC TROMETHAMINE 30 MG/ML IJ SOLN
INTRAMUSCULAR | Status: AC
  Filled 2020-03-08: qty 1

## 2020-03-08 MED ORDER — SOD CITRATE-CITRIC ACID 500-334 MG/5ML PO SOLN
30.0000 mL | ORAL | Status: DC | PRN
  Administered 2020-03-08: 18:00:00 30 mL via ORAL
  Filled 2020-03-08: qty 30

## 2020-03-08 MED ORDER — METHYLERGONOVINE MALEATE 0.2 MG PO TABS: 0.2000 mg | ORAL_TABLET | ORAL | Status: DC | PRN

## 2020-03-08 MED ORDER — BUPIVACAINE HCL (PF) 0.25 % IJ SOLN
INTRAMUSCULAR | Status: DC | PRN
  Administered 2020-03-08: 20 mL

## 2020-03-08 MED ORDER — TETANUS-DIPHTH-ACELL PERTUSSIS 5-2.5-18.5 LF-MCG/0.5 IM SUSP: 0.5000 mL | Freq: Once | INTRAMUSCULAR | Status: DC

## 2020-03-08 MED ORDER — KETOROLAC TROMETHAMINE 30 MG/ML IJ SOLN: 30.0000 mg | Freq: Four times a day (QID) | INTRAMUSCULAR | Status: AC | PRN

## 2020-03-08 MED ORDER — PHENYLEPHRINE 40 MCG/ML (10ML) SYRINGE FOR IV PUSH (FOR BLOOD PRESSURE SUPPORT)
PREFILLED_SYRINGE | INTRAVENOUS | Status: AC
  Filled 2020-03-08: qty 10

## 2020-03-08 MED ORDER — SIMETHICONE 80 MG PO CHEW: 80.0000 mg | CHEWABLE_TABLET | ORAL | Status: DC | PRN

## 2020-03-08 MED ORDER — TERBUTALINE SULFATE 1 MG/ML IJ SOLN: 0.2500 mg | Freq: Once | INTRAMUSCULAR | Status: DC | PRN

## 2020-03-08 MED ORDER — PHENYLEPHRINE HCL (PRESSORS) 10 MG/ML IV SOLN
INTRAVENOUS | Status: DC | PRN
  Administered 2020-03-08: 80 ug via INTRAVENOUS
  Administered 2020-03-08: 40 ug via INTRAVENOUS
  Administered 2020-03-08 (×2): 80 ug via INTRAVENOUS
  Administered 2020-03-08: 40 ug via INTRAVENOUS
  Administered 2020-03-08: 80 ug via INTRAVENOUS

## 2020-03-08 MED ORDER — DIBUCAINE (PERIANAL) 1 % EX OINT: 1.0000 "application " | TOPICAL_OINTMENT | CUTANEOUS | Status: DC | PRN

## 2020-03-08 MED ORDER — DIPHENHYDRAMINE HCL 50 MG/ML IJ SOLN: 12.5000 mg | INTRAMUSCULAR | Status: DC | PRN

## 2020-03-08 MED ORDER — EPHEDRINE 5 MG/ML INJ: 10.0000 mg | INTRAVENOUS | Status: DC | PRN

## 2020-03-08 MED ORDER — DEXAMETHASONE SODIUM PHOSPHATE 10 MG/ML IJ SOLN
INTRAMUSCULAR | Status: AC
  Filled 2020-03-08: qty 1

## 2020-03-08 MED ORDER — ONDANSETRON HCL 4 MG/2ML IJ SOLN
INTRAMUSCULAR | Status: AC
  Filled 2020-03-08: qty 2

## 2020-03-08 MED ORDER — LACTATED RINGERS IV SOLN: INTRAVENOUS | Status: DC

## 2020-03-08 MED ORDER — NALOXONE HCL 0.4 MG/ML IJ SOLN: 0.4000 mg | INTRAMUSCULAR | Status: DC | PRN

## 2020-03-08 MED ORDER — FENTANYL-BUPIVACAINE-NACL 0.5-0.125-0.9 MG/250ML-% EP SOLN
12.0000 mL/h | EPIDURAL | Status: DC | PRN
  Filled 2020-03-08: qty 250

## 2020-03-08 MED ORDER — SODIUM CHLORIDE (PF) 0.9 % IJ SOLN
INTRAMUSCULAR | Status: DC | PRN
  Administered 2020-03-08: 12 mL/h via EPIDURAL

## 2020-03-08 MED ORDER — SODIUM CHLORIDE 0.9% FLUSH: 3.0000 mL | INTRAVENOUS | Status: DC | PRN

## 2020-03-08 MED ORDER — DIPHENHYDRAMINE HCL 25 MG PO CAPS: 25.0000 mg | ORAL_CAPSULE | Freq: Four times a day (QID) | ORAL | Status: DC | PRN

## 2020-03-08 MED ORDER — CEFAZOLIN SODIUM-DEXTROSE 2-3 GM-%(50ML) IV SOLR
INTRAVENOUS | Status: DC | PRN
  Administered 2020-03-08: 2 g via INTRAVENOUS

## 2020-03-08 MED ORDER — PHENYLEPHRINE 40 MCG/ML (10ML) SYRINGE FOR IV PUSH (FOR BLOOD PRESSURE SUPPORT): 80.0000 ug | PREFILLED_SYRINGE | INTRAVENOUS | Status: DC | PRN

## 2020-03-08 MED ORDER — LIDOCAINE-EPINEPHRINE (PF) 2 %-1:200000 IJ SOLN
INTRAMUSCULAR | Status: AC
  Filled 2020-03-08: qty 10

## 2020-03-08 MED ORDER — MISOPROSTOL 25 MCG QUARTER TABLET
25.0000 ug | ORAL_TABLET | ORAL | Status: DC | PRN
  Administered 2020-03-08 (×2): 25 ug via VAGINAL
  Filled 2020-03-08 (×3): qty 1

## 2020-03-08 MED ORDER — KETOROLAC TROMETHAMINE 30 MG/ML IJ SOLN
30.0000 mg | Freq: Four times a day (QID) | INTRAMUSCULAR | Status: AC
  Administered 2020-03-09 – 2020-03-10 (×4): 30 mg via INTRAVENOUS
  Filled 2020-03-08 (×4): qty 1

## 2020-03-08 MED ORDER — SIMETHICONE 80 MG PO CHEW
80.0000 mg | CHEWABLE_TABLET | Freq: Three times a day (TID) | ORAL | Status: DC
  Administered 2020-03-09 – 2020-03-11 (×7): 80 mg via ORAL
  Filled 2020-03-08 (×8): qty 1

## 2020-03-08 MED ORDER — LIDOCAINE HCL (PF) 1 % IJ SOLN
30.0000 mL | INTRAMUSCULAR | Status: AC | PRN
  Administered 2020-03-08: 7 mL via SUBCUTANEOUS
  Administered 2020-03-08: 5 mL via SUBCUTANEOUS

## 2020-03-08 MED ORDER — DIPHENHYDRAMINE HCL 25 MG PO CAPS: 25.0000 mg | ORAL_CAPSULE | ORAL | Status: DC | PRN

## 2020-03-08 MED ORDER — DEXAMETHASONE SODIUM PHOSPHATE 4 MG/ML IJ SOLN
INTRAMUSCULAR | Status: DC | PRN
  Administered 2020-03-08: 4 mg via INTRAVENOUS

## 2020-03-08 MED ORDER — OXYTOCIN 40 UNITS IN NORMAL SALINE INFUSION - SIMPLE MED: 2.5000 [IU]/h | INTRAVENOUS | Status: AC

## 2020-03-08 MED ORDER — OXYTOCIN 10 UNIT/ML IJ SOLN
INTRAMUSCULAR | Status: DC | PRN
  Administered 2020-03-08: 40 [IU] via INTRAMUSCULAR

## 2020-03-08 MED ORDER — SIMETHICONE 80 MG PO CHEW
80.0000 mg | CHEWABLE_TABLET | ORAL | Status: DC
  Administered 2020-03-08 – 2020-03-10 (×3): 80 mg via ORAL
  Filled 2020-03-08 (×3): qty 1

## 2020-03-08 MED ORDER — SODIUM BICARBONATE 8.4 % IV SOLN
INTRAVENOUS | Status: DC | PRN
  Administered 2020-03-08 (×2): 5 mL via EPIDURAL

## 2020-03-08 MED ORDER — OXYTOCIN 40 UNITS IN NORMAL SALINE INFUSION - SIMPLE MED
INTRAVENOUS | Status: AC
  Filled 2020-03-08: qty 1000

## 2020-03-08 MED ORDER — NALBUPHINE HCL 10 MG/ML IJ SOLN: 5.0000 mg | Freq: Once | INTRAMUSCULAR | Status: DC | PRN

## 2020-03-08 MED ORDER — NALOXONE HCL 4 MG/10ML IJ SOLN
1.0000 ug/kg/h | INTRAVENOUS | Status: DC | PRN
  Filled 2020-03-08: qty 5

## 2020-03-08 MED ORDER — COCONUT OIL OIL: 1.0000 "application " | TOPICAL_OIL | Status: DC | PRN

## 2020-03-08 MED ORDER — LACTATED RINGERS IV SOLN: 500.0000 mL | INTRAVENOUS | Status: DC | PRN

## 2020-03-08 MED ORDER — OXYCODONE-ACETAMINOPHEN 5-325 MG PO TABS: 1.0000 | ORAL_TABLET | ORAL | Status: DC | PRN

## 2020-03-08 MED ORDER — ONDANSETRON HCL 4 MG/2ML IJ SOLN
INTRAMUSCULAR | Status: DC | PRN
  Administered 2020-03-08: 4 mg via INTRAVENOUS

## 2020-03-08 MED ORDER — PRENATAL MULTIVITAMIN CH
1.0000 | ORAL_TABLET | Freq: Every day | ORAL | Status: DC
  Administered 2020-03-09 – 2020-03-11 (×3): 1 via ORAL
  Filled 2020-03-08 (×3): qty 1

## 2020-03-08 MED ORDER — SODIUM CHLORIDE 0.9 % IV SOLN
5.0000 10*6.[IU] | Freq: Once | INTRAVENOUS | Status: AC
  Administered 2020-03-08: 5 10*6.[IU] via INTRAVENOUS
  Filled 2020-03-08: qty 5

## 2020-03-08 MED ORDER — LACTATED RINGERS IV SOLN: INTRAVENOUS | Status: DC | PRN

## 2020-03-08 MED ORDER — SENNOSIDES-DOCUSATE SODIUM 8.6-50 MG PO TABS
2.0000 | ORAL_TABLET | ORAL | Status: DC
  Administered 2020-03-08 – 2020-03-10 (×2): 2 via ORAL
  Filled 2020-03-08 (×3): qty 2

## 2020-03-08 MED ORDER — OXYTOCIN 40 UNITS IN NORMAL SALINE INFUSION - SIMPLE MED
1.0000 m[IU]/min | INTRAVENOUS | Status: DC
  Administered 2020-03-08: 2 m[IU]/min via INTRAVENOUS

## 2020-03-08 MED ORDER — PROMETHAZINE HCL 25 MG/ML IJ SOLN: 6.2500 mg | INTRAMUSCULAR | Status: DC | PRN

## 2020-03-08 MED ORDER — FENTANYL CITRATE (PF) 100 MCG/2ML IJ SOLN
INTRAMUSCULAR | Status: AC
  Filled 2020-03-08: qty 2

## 2020-03-08 MED ORDER — LACTATED RINGERS IV SOLN: 500.0000 mL | Freq: Once | INTRAVENOUS | Status: DC

## 2020-03-08 MED ORDER — WITCH HAZEL-GLYCERIN EX PADS: 1.0000 "application " | MEDICATED_PAD | CUTANEOUS | Status: DC | PRN

## 2020-03-08 MED ORDER — ZOLPIDEM TARTRATE 5 MG PO TABS: 5.0000 mg | ORAL_TABLET | Freq: Every evening | ORAL | Status: DC | PRN

## 2020-03-08 MED ORDER — ACETAMINOPHEN 500 MG PO TABS
1000.0000 mg | ORAL_TABLET | Freq: Four times a day (QID) | ORAL | Status: AC
  Administered 2020-03-08 – 2020-03-09 (×4): 1000 mg via ORAL
  Filled 2020-03-08 (×4): qty 2

## 2020-03-08 MED ORDER — METHYLERGONOVINE MALEATE 0.2 MG/ML IJ SOLN: 0.2000 mg | INTRAMUSCULAR | Status: DC | PRN

## 2020-03-08 MED ORDER — OXYTOCIN BOLUS FROM INFUSION: 500.0000 mL | Freq: Once | INTRAVENOUS | Status: DC

## 2020-03-08 MED ORDER — NALBUPHINE HCL 10 MG/ML IJ SOLN: 5.0000 mg | INTRAMUSCULAR | Status: DC | PRN

## 2020-03-08 MED ORDER — ONDANSETRON HCL 4 MG/2ML IJ SOLN: 4.0000 mg | Freq: Three times a day (TID) | INTRAMUSCULAR | Status: DC | PRN

## 2020-03-08 MED ORDER — IBUPROFEN 800 MG PO TABS
800.0000 mg | ORAL_TABLET | Freq: Four times a day (QID) | ORAL | Status: DC
  Administered 2020-03-10 – 2020-03-11 (×6): 800 mg via ORAL
  Filled 2020-03-08 (×6): qty 1

## 2020-03-08 MED ORDER — MORPHINE SULFATE (PF) 0.5 MG/ML IJ SOLN
INTRAMUSCULAR | Status: DC | PRN
  Administered 2020-03-08: 2 mg via EPIDURAL

## 2020-03-08 MED ORDER — PENICILLIN G POT IN DEXTROSE 60000 UNIT/ML IV SOLN
3.0000 10*6.[IU] | INTRAVENOUS | Status: DC
  Administered 2020-03-08 (×4): 3 10*6.[IU] via INTRAVENOUS
  Filled 2020-03-08 (×4): qty 50

## 2020-03-08 MED ORDER — MORPHINE SULFATE (PF) 0.5 MG/ML IJ SOLN
INTRAMUSCULAR | Status: AC
  Filled 2020-03-08: qty 10

## 2020-03-08 MED ORDER — FENTANYL CITRATE (PF) 100 MCG/2ML IJ SOLN
INTRAMUSCULAR | Status: DC | PRN
  Administered 2020-03-08: 100 ug via EPIDURAL

## 2020-03-08 MED ORDER — ACETAMINOPHEN 325 MG PO TABS: 650.0000 mg | ORAL_TABLET | ORAL | Status: DC | PRN

## 2020-03-08 MED ORDER — ONDANSETRON HCL 4 MG/2ML IJ SOLN: 4.0000 mg | Freq: Four times a day (QID) | INTRAMUSCULAR | Status: DC | PRN

## 2020-03-08 MED ORDER — KETOROLAC TROMETHAMINE 30 MG/ML IJ SOLN
30.0000 mg | Freq: Once | INTRAMUSCULAR | Status: AC
  Administered 2020-03-08: 21:00:00 30 mg via INTRAVENOUS

## 2020-03-08 MED ORDER — FENTANYL CITRATE (PF) 100 MCG/2ML IJ SOLN: 25.0000 ug | INTRAMUSCULAR | Status: DC | PRN

## 2020-03-08 MED ORDER — ACETAMINOPHEN 500 MG PO TABS: 1000.0000 mg | ORAL_TABLET | Freq: Once | ORAL | Status: DC

## 2020-03-08 MED ORDER — OXYTOCIN 40 UNITS IN NORMAL SALINE INFUSION - SIMPLE MED
2.5000 [IU]/h | INTRAVENOUS | Status: DC
  Filled 2020-03-08: qty 1000

## 2020-03-08 MED ORDER — MENTHOL 3 MG MT LOZG: 1.0000 | LOZENGE | OROMUCOSAL | Status: DC | PRN

## 2020-03-08 MED ORDER — BUPIVACAINE HCL (PF) 0.25 % IJ SOLN
INTRAMUSCULAR | Status: AC
  Filled 2020-03-08: qty 20

## 2020-03-08 MED ORDER — SODIUM CHLORIDE 0.9 % IV SOLN: INTRAVENOUS | Status: DC | PRN

## 2020-03-08 SURGICAL SUPPLY — 40 items
CHLORAPREP W/TINT 26ML (MISCELLANEOUS) ×3 IMPLANT
CLAMP CORD UMBIL (MISCELLANEOUS) IMPLANT
CLOSURE WOUND 1/2 X4 (GAUZE/BANDAGES/DRESSINGS) ×1
CLOTH BEACON ORANGE TIMEOUT ST (SAFETY) ×3 IMPLANT
DRSG OPSITE POSTOP 4X10 (GAUZE/BANDAGES/DRESSINGS) ×3 IMPLANT
ELECT REM PT RETURN 9FT ADLT (ELECTROSURGICAL) ×3
ELECTRODE REM PT RTRN 9FT ADLT (ELECTROSURGICAL) ×1 IMPLANT
EXTRACTOR VACUUM M CUP 4 TUBE (SUCTIONS) IMPLANT
EXTRACTOR VACUUM M CUP 4' TUBE (SUCTIONS)
GLOVE BIO SURGEON STRL SZ7.5 (GLOVE) ×3 IMPLANT
GLOVE BIOGEL PI IND STRL 7.0 (GLOVE) ×1 IMPLANT
GLOVE BIOGEL PI INDICATOR 7.0 (GLOVE) ×2
GOWN STRL REUS W/TWL LRG LVL3 (GOWN DISPOSABLE) ×6 IMPLANT
HOVERMATT SINGLE USE (MISCELLANEOUS) ×2 IMPLANT
KIT ABG SYR 3ML LUER SLIP (SYRINGE) IMPLANT
KIT PREVENA INCISION MGT20CM45 (CANNISTER) ×2 IMPLANT
NDL HYPO 25X5/8 SAFETYGLIDE (NEEDLE) IMPLANT
NDL SPNL 20GX3.5 QUINCKE YW (NEEDLE) IMPLANT
NEEDLE HYPO 22GX1.5 SAFETY (NEEDLE) ×3 IMPLANT
NEEDLE HYPO 25X5/8 SAFETYGLIDE (NEEDLE) IMPLANT
NEEDLE SPNL 20GX3.5 QUINCKE YW (NEEDLE) IMPLANT
NS IRRIG 1000ML POUR BTL (IV SOLUTION) ×3 IMPLANT
PACK C SECTION WH (CUSTOM PROCEDURE TRAY) ×3 IMPLANT
PENCIL SMOKE EVAC W/HOLSTER (ELECTROSURGICAL) ×3 IMPLANT
PREVENA RESTOR AXIOFORM 29X28 (GAUZE/BANDAGES/DRESSINGS) ×2 IMPLANT
STRIP CLOSURE SKIN 1/2X4 (GAUZE/BANDAGES/DRESSINGS) ×1 IMPLANT
SUT MNCRL 0 VIOLET CTX 36 (SUTURE) ×2 IMPLANT
SUT MNCRL AB 3-0 PS2 27 (SUTURE) IMPLANT
SUT MON AB 2-0 CT1 27 (SUTURE) ×3 IMPLANT
SUT MON AB-0 CT1 36 (SUTURE) ×6 IMPLANT
SUT MONOCRYL 0 CTX 36 (SUTURE) ×6
SUT PLAIN 0 NONE (SUTURE) IMPLANT
SUT PLAIN 2 0 (SUTURE)
SUT PLAIN 2 0 XLH (SUTURE) IMPLANT
SUT PLAIN ABS 2-0 CT1 27XMFL (SUTURE) IMPLANT
SYR 20CC LL (SYRINGE) IMPLANT
SYR CONTROL 10ML LL (SYRINGE) ×3 IMPLANT
TOWEL OR 17X24 6PK STRL BLUE (TOWEL DISPOSABLE) ×3 IMPLANT
TRAY FOLEY W/BAG SLVR 14FR LF (SET/KITS/TRAYS/PACK) ×3 IMPLANT
WATER STERILE IRR 1000ML POUR (IV SOLUTION) ×3 IMPLANT

## 2020-03-08 NOTE — Progress Notes (Addendum)
Linda Burnett is a 34 y.o. G2P0010 at [redacted]w[redacted]d by LMP admitted for induction of labor due to 40 wk IVF pregnancy..  Subjective: Comfortable post epidural s/p SROM  Objective: BP 130/77   Pulse 89   Temp 98.8 F (37.1 C)   Resp 18   Ht 5\' 5"  (1.651 m)   Wt 110.2 kg   BMI 40.44 kg/m  No intake/output data recorded. No intake/output data recorded.  FHT:  150s, accels noted, No decels. Category 1 UC:   irregular, every 2-4 minutes SVE:   Dilation: 2 Effacement (%): Thick Station: -3 Exam by:: Anikah Hogge 230 MVU by [facility] since placement 4hrs ago Labs: Lab Results  Component Value Date   WBC 7.9 03/08/2020   HGB 10.2 (L) 03/08/2020   HCT 32.5 (L) 03/08/2020   MCV 85.1 03/08/2020   PLT 283 03/08/2020    Assessment / Plan: IVF pregnancy 40wks LGA-  ? True  CPD  Fetal vertex at pelvic inlet ? Occult cord  Labor: Poorly engaged vertex, likely CPD Preeclampsia:  no signs or symptoms of toxicity Fetal Wellbeing:  Category I Pain Control:  Epidural I/D:  n/a Anticipated MOD:  Proceed with csection. Risks vs benefits of surgery discussed.  Patient seen and examined. Consent witnessed and signed. No changes noted. Update completed. [name] J 03/08/2020, 6:33 PM

## 2020-03-08 NOTE — Op Note (Signed)
Cesarean Section Procedure Note  Indications: cephalo-pelvic disproportion, macrosomia, and Failed IOL  Pre-operative Diagnosis: 40 week 0 day pregnancy.  Post-operative Diagnosis: same  Surgeon: Lovenia Kim   Assistants: Eddie Dibbles, CNM  Anesthesia: Epidural anesthesia and Local anesthesia 0.25.% bupivacaine  ASA Class: 2  Procedure Details  The patient was seen in the Holding Room. The risks, benefits, complications, treatment options, and expected outcomes were discussed with the patient.  The patient concurred with the proposed plan, giving informed consent. The risks of anesthesia, infection, bleeding and possible injury to other organs discussed. Injury to bowel, bladder, or ureter with possible need for repair discussed. Possible need for transfusion with secondary risks of hepatitis or HIV acquisition discussed. Post operative complications to include but not limited to DVT, PE and Pneumonia noted. The site of surgery properly noted/marked. The patient was taken to Operating Room # A, identified as Umu Schillo and the procedure verified as C-Section Delivery. A Time Out was held and the above information confirmed.  After induction of anesthesia, the patient was draped and prepped in the usual sterile manner. A Pfannenstiel incision was made and carried down through the subcutaneous tissue to the fascia. Fascial incision was made and extended transversely using Mayo scissors. The fascia was separated from the underlying rectus tissue superiorly and inferiorly. The peritoneum was identified and entered. Peritoneal incision was extended longitudinally. The utero-vesical peritoneal reflection was incised transversely and the bladder flap was bluntly freed from the lower uterine segment. A low transverse uterine incision(Kerr hysterotomy) was made. Delivered from OT presentation (above pelvic inlet)was a  female with Apgar scores of 8 at one minute and 9 at five minutes. Bulb suctioning  gently performed. Neonatal team in attendance.After the umbilical cord was clamped and cut cord blood was obtained for evaluation. The placenta was removed intact and appeared normal. The uterus was curetted with a dry lap pack. Good hemostasis was noted.The uterine outline, tubes and ovaries appeared normal. The uterine incision was closed with running locked sutures of 0 Monocryl x 2 layers. Hemostasis was observed. Lavage was carried out until clear.The parietal peritoneum was closed with a running 2-0 Monocryl suture. The fascia was then reapproximated with running sutures of 0 Monocryl. The skin was reapproximated with 4-0 vicryl after Shelton closure with 2-0 plain.Prevena negative pressure dressing placed.  Instrument, sponge, and needle counts were correct prior the abdominal closure and at the conclusion of the case.   Findings: FTLM, true CPD. NL uterus, nl adnexa  Estimated Blood Loss:   500         Drains: foley                 Specimens: placenta                 Complications:  None; patient tolerated the procedure well.         Disposition: PACU - hemodynamically stable.         Condition: stable  Attending Attestation: I performed the procedure.

## 2020-03-08 NOTE — Progress Notes (Signed)
Linda Burnett is a 34 y.o. G2P0010 at [redacted]w[redacted]d by LMP admitted for induction of labor due to 40 wk IVF pregnancy..  Subjective: Comfortable post epidural s/p SROM  Objective: BP 125/77   Pulse 80   Temp 98.7 F (37.1 C) (Oral)   Resp 18   Ht 5\' 5"  (1.651 m)   Wt 110.2 kg   BMI 40.44 kg/m  No intake/output data recorded. No intake/output data recorded.  FHT:  150s, accels noted, No decels. Category 1 UC:   irregular, every 2-4 minutes SVE:   Dilation: 2 Effacement (%): Thick Station: -3 Exam by:: Glendal Cassaday  Labs: Lab Results  Component Value Date   WBC 7.9 03/08/2020   HGB 10.2 (L) 03/08/2020   HCT 32.5 (L) 03/08/2020   MCV 85.1 03/08/2020   PLT 283 03/08/2020    Assessment / Plan: IVF pregnancy 40wks LGA-  Likely CPD  Labor: Poorly engaged vertex, likely CPD Preeclampsia:  no signs or symptoms of toxicity Fetal Wellbeing:  Category I Pain Control:  Epidural I/D:  n/a Anticipated MOD:  Guarded, likely csection due to CPD  Elston Aldape J 03/08/2020, 3:46 PM

## 2020-03-08 NOTE — Anesthesia Postprocedure Evaluation (Signed)
Anesthesia Post Note  Patient: Issa Digeronimo  Procedure(s) Performed: CESAREAN SECTION (N/A )     Patient location during evaluation: PACU Anesthesia Type: Epidural Level of consciousness: awake and alert and oriented Pain management: pain level controlled Vital Signs Assessment: post-procedure vital signs reviewed and stable Respiratory status: spontaneous breathing, nonlabored ventilation and respiratory function stable Cardiovascular status: blood pressure returned to baseline Postop Assessment: no apparent nausea or vomiting and epidural receding Anesthetic complications: no    Last Vitals:  Vitals:   03/08/20 2030 03/08/20 2045  BP: (!) 86/52   Pulse: 87   Resp: 16   Temp:  36.9 C  SpO2: 97%     Last Pain:  Vitals:   03/08/20 2045  TempSrc: Oral  PainSc: 0-No pain   Pain Goal:    LLE Motor Response: Purposeful movement (03/08/20 2045) LLE Sensation: Tingling (03/08/20 2045) RLE Motor Response: Purposeful movement (03/08/20 2045) RLE Sensation: Tingling (03/08/20 2045)     Epidural/Spinal Function Cutaneous sensation: Able to Wiggle Toes (03/08/20 2045), Patient able to flex knees: Yes (03/08/20 2045), Patient able to lift hips off bed: No (03/08/20 2045), Back pain beyond tenderness at insertion site: No (03/08/20 2045), Progressively worsening motor and/or sensory loss: No (03/08/20 2045), Bowel and/or bladder incontinence post epidural: No (03/08/20 2045)  Brennan Bailey

## 2020-03-08 NOTE — Anesthesia Procedure Notes (Addendum)
Epidural Patient location during procedure: OB Start time: 03/08/2020 1:50 PM End time: 03/08/2020 1:53 PM  Staffing Anesthesiologist: Lyn Hollingshead, MD Performed: anesthesiologist   Preanesthetic Checklist Completed: patient identified, IV checked, site marked, risks and benefits discussed, surgical consent, monitors and equipment checked, pre-op evaluation and timeout performed  Epidural Patient position: sitting Prep: DuraPrep and site prepped and draped Patient monitoring: continuous pulse ox and blood pressure Approach: midline Location: L3-L4 Injection technique: LOR air  Needle:  Needle type: Tuohy  Needle gauge: 17 G Needle length: 9 cm and 9 Needle insertion depth: 7 cm Catheter type: closed end flexible Catheter size: 19 Gauge Catheter at skin depth: 12 cm Test dose: negative and Other  Assessment Events: blood not aspirated, injection not painful, no injection resistance, no paresthesia and negative IV test  Additional Notes Reason for block:procedure for pain

## 2020-03-08 NOTE — Anesthesia Preprocedure Evaluation (Addendum)
Anesthesia Evaluation  Patient identified by MRN, date of birth, ID band Patient awake    Reviewed: Allergy & Precautions, H&P , NPO status , Patient's Chart, lab work & pertinent test results  Airway Mallampati: II  TM Distance: >3 FB Neck ROM: full    Dental no notable dental hx. (+) Teeth Intact   Pulmonary former smoker,    Pulmonary exam normal breath sounds clear to auscultation       Cardiovascular negative cardio ROS Normal cardiovascular exam Rhythm:regular Rate:Normal     Neuro/Psych negative neurological ROS  negative psych ROS   GI/Hepatic Neg liver ROS, GERD  Medicated and Controlled,  Endo/Other  Morbid obesity  Renal/GU   negative genitourinary   Musculoskeletal   Abdominal (+) + obese,   Peds  Hematology negative hematology ROS (+)   Anesthesia Other Findings   Reproductive/Obstetrics (+) Pregnancy                             Anesthesia Physical Anesthesia Plan  ASA: III and emergent  Anesthesia Plan: Epidural   Post-op Pain Management:    Induction:   PONV Risk Score and Plan: 4 or greater and Treatment may vary due to age or medical condition, Ondansetron and Dexamethasone  Airway Management Planned: Natural Airway and Simple Face Mask  Additional Equipment: None  Intra-op Plan:   Post-operative Plan:   Informed Consent: I have reviewed the patients History and Physical, chart, labs and discussed the procedure including the risks, benefits and alternatives for the proposed anesthesia with the patient or authorized representative who has indicated his/her understanding and acceptance.       Plan Discussed with: CRNA  Anesthesia Plan Comments: (Epidural to be used for C/S (failure to progress). -Edelin Fryer,MD)       Anesthesia Quick Evaluation

## 2020-03-08 NOTE — H&P (Signed)
Linda Burnett is a 34 y.o. female presenting for IOL for 39 wk with history of IVF. OB History    Gravida  2   Para      Term      Preterm      AB  1   Living        SAB  1   TAB      Ectopic      Multiple      Live Births             Past Medical History:  Diagnosis Date  . Kidney stone   . Polycystic ovary syndrome   . Prediabetes   . Tubular adenoma 2017   Past Surgical History:  Procedure Laterality Date  . COLONOSCOPY N/A 04/05/2016   Dr.Rourk- 2-57mm polyps at the hepatic flexure, one 100mm polyp in the descending colon, the examined portion of the ileum was normal, the examination was o/w normal on direct and retroflexion views. bx= tubular adenoma and hyperplastic polyp.  . DENTAL SURGERY    . TONSILLECTOMY    . UTERINE SEPTUM RESECTION     "took a uterine septum out before IVF"   Family History: family history includes Colon cancer in her father and paternal grandmother; Diabetes in her maternal grandmother and paternal grandmother; Heart attack in her maternal uncle. Social History:  reports that she quit smoking about 4 years ago. Her smoking use included cigarettes. She has never used smokeless tobacco. She reports that she does not drink alcohol or use drugs.     Maternal Diabetes: No Genetic Screening: Normal Maternal Ultrasounds/Referrals: Normal Fetal Ultrasounds or other Referrals:  None Maternal Substance Abuse:  No Significant Maternal Medications:  None Significant Maternal Lab Results:  Group B Strep negative Other Comments:  None  Review of Systems  Constitutional: Negative.   All other systems reviewed and are negative.  Maternal Medical History:  Fetal activity: Perceived fetal activity is normal.   Last perceived fetal movement was within the past hour.    Prenatal complications: no prenatal complications Prenatal Complications - Diabetes: none.    Dilation: 1 Effacement (%): Thick Station: Ballotable Exam by:: [employer] RN Blood pressure 118/69, pulse 87, temperature 98.7 F (37.1 C), temperature source Oral, resp. rate 18, height 5\' 5"  (1.651 m), weight 110.2 kg. Maternal Exam:  Uterine Assessment: Contraction strength is mild.  Contraction frequency is irregular.   Abdomen: Patient reports no abdominal tenderness. Fetal presentation: vertex  Introitus: Normal vulva. Normal vagina.  Ferning test: not done.  Nitrazine test: not done.  Pelvis: questionable for delivery.   Cervix: Cervix evaluated by digital exam.     Physical Exam  Nursing note and vitals reviewed. Constitutional: She is oriented to person, place, and time. She appears well-developed and well-nourished.  HENT:  Head: Normocephalic and atraumatic.  Cardiovascular: Normal rate and regular rhythm.  Respiratory: Effort normal and breath sounds normal.  GI: Soft. Bowel sounds are normal.  Genitourinary:    Vulva, vagina and uterus normal.   Musculoskeletal:        General: Normal range of motion.     Cervical back: Normal range of motion and neck supple.  Neurological: She is alert and oriented to person, place, and time. She has normal reflexes.  Skin: Skin is warm and dry.  Psychiatric: She has a normal mood and affect.    Prenatal labs: ABO, Rh: --/--/O POS, O POS Performed at [facility], [place] [address],  [place], [place] [address]  [phone]) Antibody: NEG (03/10 0025) Rubella: Immune (08/13 0000) RPR: Nonreactive (08/13 0000)  HBsAg: Negative (08/13 0000)  HIV: Non-reactive (08/13 0000)  GBS: Positive/-- (02/09 0000)   Assessment/Plan: 39+ wk IUP History of Uterine septum resection IVF pregnancy LGA by sono and [place] IOL   Joenathan Sakuma J 03/08/2020, 6:29 AM

## 2020-03-08 NOTE — Transfer of Care (Signed)
Immediate Anesthesia Transfer of Care Note  Patient: Linda Burnett  Procedure(s) Performed: CESAREAN SECTION (N/A )  Patient Location: PACU  Anesthesia Type:Epidural  Level of Consciousness: awake, alert  and patient cooperative  Airway & Oxygen Therapy: Patient Spontanous Breathing  Post-op Assessment: Report given to RN, Post -op Vital signs reviewed and stable and Patient moving all extremities X 4  Post vital signs: Reviewed and stable  Last Vitals:  Vitals Value Taken Time  BP 95/57 03/08/20 2016  Temp    Pulse 89 03/08/20 2020  Resp 20 03/08/20 2020  SpO2 99 % 03/08/20 2020  Vitals shown include unvalidated device data.  Last Pain:  Vitals:   03/08/20 1731  TempSrc:   PainSc: 0-No pain         Complications: No apparent anesthesia complications

## 2020-03-09 ENCOUNTER — Encounter: Payer: Self-pay | Admitting: *Deleted

## 2020-03-09 LAB — CBC
HCT: 25.1 % — ABNORMAL LOW (ref 36.0–46.0)
Hemoglobin: 7.9 g/dL — ABNORMAL LOW (ref 12.0–15.0)
MCH: 26 pg (ref 26.0–34.0)
MCHC: 31.5 g/dL (ref 30.0–36.0)
MCV: 82.6 fL (ref 80.0–100.0)
Platelets: 249 10*3/uL (ref 150–400)
RBC: 3.04 MIL/uL — ABNORMAL LOW (ref 3.87–5.11)
RDW: 14 % (ref 11.5–15.5)
WBC: 11.8 10*3/uL — ABNORMAL HIGH (ref 4.0–10.5)
nRBC: 0 % (ref 0.0–0.2)

## 2020-03-09 MED ORDER — LACTATED RINGERS IV BOLUS
500.0000 mL | Freq: Once | INTRAVENOUS | Status: AC
  Administered 2020-03-09: 15:00:00 500 mL via INTRAVENOUS

## 2020-03-09 MED ORDER — SODIUM CHLORIDE 0.9 % IV SOLN
510.0000 mg | Freq: Once | INTRAVENOUS | Status: AC
  Administered 2020-03-09: 16:00:00 510 mg via INTRAVENOUS
  Filled 2020-03-09: qty 17

## 2020-03-09 MED ORDER — POLYSACCHARIDE IRON COMPLEX 150 MG PO CAPS
150.0000 mg | ORAL_CAPSULE | Freq: Every day | ORAL | Status: DC
  Administered 2020-03-10 – 2020-03-11 (×2): 150 mg via ORAL
  Filled 2020-03-09 (×2): qty 1

## 2020-03-09 MED ORDER — MAGNESIUM OXIDE 400 (241.3 MG) MG PO TABS
400.0000 mg | ORAL_TABLET | Freq: Every day | ORAL | Status: DC
  Administered 2020-03-10 – 2020-03-11 (×2): 400 mg via ORAL
  Filled 2020-03-09 (×2): qty 1

## 2020-03-09 NOTE — Lactation Note (Signed)
This note was copied from a baby's chart. Lactation Consultation Note  Patient Name: Linda Burnett [id number] Date: 03/09/2020 Reason for consult: Initial assessment;1st time breastfeeding;Other (Comment)(LGA infant greater than 9 lbs at birth.) Mom with C/S delivery with IVF. Infant has one void since birth, Per mom, she feels breastfeeding is going well infant latched for 8 minutes at last feeding, prior to [facility] entering the room. Parents having been doing STS with infant. Mom has medela DEBP at home. LC discussed hand expression, mom taught back and colostrum is not present in breast currently.  Mom will use DEBP, pump every 3 hours for 15 minutes. Mom shown how to use DEBP & how to disassemble, clean, & reassemble parts. Per mom, if she needs to supplement until her milk supply is establish she doesn't want to use donor breast milk but prefers formula. Mom knows to breastfeed infant according to hunger cues, 8 to 12 times within 24 hours, on demand and not exceed 3 hours without breastfeeding infant. Mom knows to call RN or [place] if she needs assistance with latching infant at breast. Mom made aware of O/P services, breastfeeding support groups, community resources, and our phone # for post-discharge questions.  Maternal Data Formula Feeding for Exclusion: No Has patient been taught Hand Expression?: Yes Does the patient have breastfeeding experience prior to this delivery?: No  Feeding Feeding Type: Breast Fed  LATCH Score Latch: Repeated attempts needed to sustain latch, nipple held in mouth throughout feeding, stimulation needed to elicit sucking reflex.  Audible Swallowing: A few with stimulation  Type of Nipple: Everted at rest and after stimulation  Comfort (Breast/Nipple): Soft / non-tender  Hold (Positioning): Assistance needed to correctly position infant at breast and maintain latch.  LATCH Score: 7  Interventions Interventions: Breast feeding basics reviewed;Skin  to skin;DEBP;Breast compression  Lactation Tools Discussed/Used WIC Program: No Pump Review: Setup, frequency, and cleaning;Milk Storage Initiated by:: Vicente Serene, IBCLC Date initiated:: 03/09/20   Consult Status Consult Status: Follow-up Date: 03/09/20 Follow-up type: In-patient    Vicente Serene 03/09/2020, 3:02 AM

## 2020-03-09 NOTE — Progress Notes (Signed)
POSTOPERATIVE DAY # 1S/P Primary LTCS for CPD, macrosomia, failed IOL, baby boy "Damaris Schooner"  S:         Reports feeling well, some sorness             Tolerating po intake / no nausea / no vomiting / no flatus / no BM  Denies dizziness, SOB, or CP             Bleeding is light             Pain controlled with Motrin and Tylenol             Up ad lib / ambulatory/ foley catheter in place due to low UO and dark amber color  Newborn breast feeding - going okay, but still needing some assistance  / Circumcision - planning   O:  VS: BP 115/74 (BP Location: Right Arm)   Pulse 73   Temp 98.5 F (36.9 C) (Oral)   Resp 18   Ht 5\' 5"  (1.651 m)   Wt 110.2 kg   SpO2 97%   Breastfeeding Unknown   BMI 40.44 kg/m    LABS:               Recent Labs    03/08/20 0020 03/09/20 0617  WBC 7.9 11.8*  HGB 10.2* 7.9*  PLT 283 249               Bloodtype: --/--/O POS, O POS Performed at [facility], [place] [address] [place], [place] [address]  [phone])  Rubella: Immune (08/13 0000)                                             I&O: Intake/Output      03/10 0701 - 03/11 0700 03/11 0701 - 03/12 0700   I.V. (mL/kg) 3200 (29)    Other 0    Total Intake(mL/kg) 3200 (29)    Urine (mL/kg/hr) 1450 (0.5) 150 (0.2)   Blood 415    Total Output 1865 150   Net +1335 -150                     Physical Exam:             Alert and Oriented X3  Lungs: Clear and unlabored  Heart: regular rate and rhythm / no murmurs  Abdomen: soft, non-tender, obese, mild gaseous distention             Fundus: firm, non-tender, U-2             Dressing: Prevena wound dressing intact and to suction              Incision:  approximated with sutures / unable to visualize due to dressing  Perineum: intact  Lochia: small rubra on pad  Extremities: +1 pedal edema, no calf pain or tenderness,   A/P:    POD # 1 S/P Primary LTCS for CPD, macrosomia, Failed IOL            ABL Anemia compounding chronic IDA   -  Feraheme x 1 dose   - Begin Niferex 150mg  PO daily tomorrow    - Magnesium oxide 400mg  daily  Low urinary output   - LR bolus 512mL x 1    - Remove foley when UO adequate  Routine postoperative care  Lactation support  Warm liquids, ambulation to promote bowel motility   Continue current care   Lars Pinks, MSN, CNM [facility]

## 2020-03-10 NOTE — Progress Notes (Signed)
SVD: primary  S:  Pt reports feeling well/ Tolerating po/ Voiding without problems/ No n/v/ Bleeding is moderate/ Pain controlled withprescription NSAID's including ibuprofen (Motrin) and narcotic analgesics including oxycodone/acetaminophen (Percocet, Tylox)    O:  A & O x 3 / VS: Blood pressure 123/82, pulse 87, temperature 98.2 F (36.8 C), temperature source Oral, resp. rate 18, height 5\' 5"  (1.651 m), weight 110.2 kg, SpO2 99 %, unknown if currently breastfeeding.  LABS: No results found for this or any previous visit (from the past 24 hour(s)).  I&O: I/O last 3 completed shifts: In: 3200 [I.V.:3200] Out: [id number] [Urine:2850; Blood:415]   No intake/output data recorded.  Lungs: chest clear, no wheezing, rales, normal symmetric air entry  Heart: regular rate and rhythm, S1, S2 normal, no murmur, click, rub or gallop  Abdomen:  Obese soft uterus @ umb firm  Perineum: is normal  Lochia: mod  Extremities:edema 2+  Wound vac  A/P: POD # 1/PPD # 1 [id number]  Doing well  Continue routine post partum orders

## 2020-03-10 NOTE — Lactation Note (Signed)
This note was copied from a baby's chart. Lactation Consultation Note  Patient Name: Linda Burnett [id number] Date: 03/10/2020 Reason for consult: Follow-up assessment;Infant weight loss P1, 31 hour term female infant, weight loss -3%. Infant had 4 voids and 3 stools today. Per mom, she has been using DEBP as advised, colostrum is still not present in breast when Mom and LC hand express. Per mom, infant is latching well at breast and will breastfed 10 minutes and then she is supplementing with 21-25  mls of Similac Advance with iron most feedings. Mom is using DEBP every 3 hours as advised earlier by [facility].  Mom is doing STS with infant .  Mom will call RN or LC if she has any questions, concerns or need assistance with latching infant at breast.  Maternal Data    Feeding Feeding Type: Formula Nipple Type: Slow - flow  LATCH Score Latch: Grasps breast easily, tongue down, lips flanged, rhythmical sucking.  Audible Swallowing: A few with stimulation  Type of Nipple: Everted at rest and after stimulation  Comfort (Breast/Nipple): Soft / non-tender  Hold (Positioning): Assistance needed to correctly position infant at breast and maintain latch.  LATCH Score: 8  Interventions Interventions: Support pillows;Adjust position;Position options;Breast compression;Skin to skin  Lactation Tools Discussed/Used     Consult Status Consult Status: Follow-up Date: 03/10/20 Follow-up type: In-patient    Vicente Serene 03/10/2020, 2:31 AM

## 2020-03-11 MED ORDER — ACETAMINOPHEN 500 MG PO TABS: 500.0000 mg | ORAL_TABLET | Freq: Four times a day (QID) | ORAL | 0 refills | Status: DC | PRN

## 2020-03-11 MED ORDER — FERROUS SULFATE 325 (65 FE) MG PO TABS: 325.0000 mg | ORAL_TABLET | Freq: Every day | ORAL | 0 refills | Status: DC

## 2020-03-11 MED ORDER — IBUPROFEN 800 MG PO TABS: 800.0000 mg | ORAL_TABLET | Freq: Four times a day (QID) | ORAL | 0 refills | Status: DC

## 2020-03-11 NOTE — Progress Notes (Addendum)
POD #3, Emergency Primary LTCS for failed IOL/ CPD. IVF preg   S:  Feels well, ready to go home   O:  A & O x 3 / VS: Blood pressure 122/79, pulse 84, temperature 98.1 F (36.7 C), temperature source Oral, resp. rate 18, height 5\' 5"  (1.651 m), weight 110.2 kg, SpO2 99 %, unknown if currently breastfeeding.  Lungs: chest clear, no wheezing, rales, normal symmetric air entry  Heart: regular rate and rhythm, S1, S2 normal, no murmur, click, rub or gallop  Abdomen:  Obese soft uterus @ umb firm. Dressing dry  Perineum: is normal  Lochia: mod  Extremities:edema 2+  Pravena dressing vac  A/P: POD # 3 1' LTCS, [id number]. Boy, s/p circ.   Doing well ABL on chronic anemia, s/p IV Feraheme, continue PO iron for 30 more days PP care and Post op care reviewed with warning s/s and reportable concerns.  F/up in office on 3/17 for dressing removal  F/up Dr Ronita Hipps in 6 wks for [facility] check   -V.Lysle Yero MD

## 2020-03-11 NOTE — Discharge Summary (Signed)
Obstetric Discharge Summary Reason for Admission: induction of labor for IVF pregnancy, 39.6 weeks.   Prenatal Procedures: Routine PNCare, cervical length sonos, anatomy and growth sonos. S>D, LGA, no GDM Intrapartum Procedures: Emergency Low Transverse C-section for unengaged fetal head, LGA, CPD Postpartum Procedures: IV Feraheme x 1 dose on [id number] Complications-Operative and Postpartum: acute blood loss anemia superimposed on chronic anemia  Hemoglobin  Date Value Ref Range Status  03/09/2020 7.9 (L) 12.0 - 15.0 g/dL Final    Comment:    QUANTITY NOT SUFFICIENT TO REPEAT TEST   HCT  Date Value Ref Range Status  03/09/2020 25.1 (L) 36.0 - 46.0 % Final    Physical Exam:  General: alert and cooperative Lungs CTA bilat CV RRR Lochia: appropriate Uterine Fundus: firm Incision: healing well DVT Evaluation: No evidence of DVT seen on physical exam.  Discharge Diagnoses: Term Pregnancy-delivered by emergency LTCS  Discharge Information: Date: 03/11/2020 Activity: pelvic rest Diet: routine Medications: PNV, Ibuprofen and Tylenol and Iron Condition: stable Instructions: refer to practice specific booklet Discharge to: home  Pravena vacuum dressing removal on Day7. Prefers to come to office.   Follow-up Information    Brien Few, MD Follow up in 6 week(s).   Specialty: Obstetrics and Gynecology Why: but also see [facility] nurse on 3/17 for dressing removal. Call on Mon- 3/15 to set up visits  Contact information: [place] [address] [phone]          Newborn Data: Live born female  Birth Weight: 9 lb 3.8 oz (4190 g) APGAR: 70, 9  Newborn Delivery   Birth date/time: 03/[birth year]/2020 19:22:00 Delivery type: C-Section, Low Transverse C-section categorization: Primary     Home with mother.  Elveria Royals 03/11/2020, 10:05 AM

## 2020-03-11 NOTE — Lactation Note (Signed)
This note was copied from a baby's chart. Lactation Consultation Note  Patient Name: Linda Burnett [id number] Date: 03/11/2020 Reason for consult: Follow-up assessment  P1 mother whose infant is now 91 hours old.  This is a term baby at 39+6 weeks.  Baby was asleep on mother's chest when I arrived.  Mother has been breast feeding and supplementing; volumes have been appropriate.  Encouraged mother to continue putting baby to breast prior to any supplementation to help encourage milk supply.  Mother has been doing this and informed me that baby is just "so hungry" that he does not get satisfied.    Mother's breasts are soft and non tender but she does have some sensitivity to her nipples.  Educated her on the importance of using her EBM/coconut oil for comfort.  Mother was not aware of these comfort measures.  Provided coconut oil.  Engorgement prevention/treatment reviewed.  Manual pump given and a #27 flange for adequate sizing.  Mother has been using the DEBP as well for breast stimulation.  She has a DEBP for home use and our phone number for questions/concerns after discharge.  Notified RN that baby was asleep so tech can finish the hearing screen.     Maternal Data    Feeding Feeding Type: Bottle Fed - Formula Nipple Type: Slow - flow  LATCH Score                   Interventions    Lactation Tools Discussed/Used     Consult Status Consult Status: Complete Date: 03/11/20 Follow-up type: Call as needed    Linda Burnett Linda Burnett 03/11/2020, 7:49 AM

## 2020-03-12 ENCOUNTER — Ambulatory Visit: Payer: Self-pay

## 2020-03-12 NOTE — Lactation Note (Signed)
This note was copied from a baby's chart. Lactation Consultation Note  Patient Name: Linda Burnett [id number] Date: 03/12/2020 Reason for consult: Follow-up assessment;Hyperbilirubinemia Baby is 64 hours old.  Bili level is down this morning and phototherapy discontinued.  Mom is currently bottle feeding baby.  She states she puts baby to breast prior to formula supplementation although breastfeeding is not documented.  Mom states she does not have enough for baby.  Discussed milk coming to volume and the prevention and treatment of engorgement.  Mom has a breast pump for prn use.  No questions or concerns.  Reviewed outpatient services and encouraged to call prn.  Maternal Data    Feeding    LATCH Score                   Interventions    Lactation Tools Discussed/Used     Consult Status Consult Status: Complete Follow-up type: Call as needed    Ave Filter 03/12/2020, 9:01 AM

## 2020-06-22 ENCOUNTER — Emergency Department (HOSPITAL_COMMUNITY)
Admission: EM | Admit: 2020-06-22 | Discharge: 2020-06-23 | Disposition: A | Discharge status: Home / Self Care | Payer: 59 | Attending: Emergency Medicine | Admitting: Emergency Medicine

## 2020-06-22 ENCOUNTER — Other Ambulatory Visit: Payer: Self-pay

## 2020-06-22 ENCOUNTER — Encounter (HOSPITAL_COMMUNITY): Payer: Self-pay

## 2020-06-22 DIAGNOSIS — N938 Other specified abnormal uterine and vaginal bleeding: Secondary | ICD-10-CM | POA: Diagnosis not present

## 2020-06-22 DIAGNOSIS — N939 Abnormal uterine and vaginal bleeding, unspecified: Secondary | ICD-10-CM | POA: Diagnosis not present

## 2020-06-22 DIAGNOSIS — Z87891 Personal history of nicotine dependence: Secondary | ICD-10-CM | POA: Insufficient documentation

## 2020-06-22 LAB — CBC
HCT: 37.7 % (ref 36.0–46.0)
Hemoglobin: 11.8 g/dL — ABNORMAL LOW (ref 12.0–15.0)
MCH: 26.9 pg (ref 26.0–34.0)
MCHC: 31.3 g/dL (ref 30.0–36.0)
MCV: 85.9 fL (ref 80.0–100.0)
Platelets: 318 10*3/uL (ref 150–400)
RBC: 4.39 MIL/uL (ref 3.87–5.11)
RDW: 15 % (ref 11.5–15.5)
WBC: 9.7 10*3/uL (ref 4.0–10.5)
nRBC: 0 % (ref 0.0–0.2)

## 2020-06-22 LAB — I-STAT BETA HCG BLOOD, ED (MC, WL, AP ONLY): I-stat hCG, quantitative: <5 m[IU]/mL (ref <5)

## 2020-06-22 NOTE — ED Triage Notes (Signed)
Pt arrives to ED w/ c/o vaginal bleeding. Pt had c-section 12 weeks ago. Pt states she is soaking pad ever 15-30 seconds.

## 2020-06-23 DIAGNOSIS — N939 Abnormal uterine and vaginal bleeding, unspecified: Secondary | ICD-10-CM | POA: Diagnosis not present

## 2020-06-23 DIAGNOSIS — N938 Other specified abnormal uterine and vaginal bleeding: Secondary | ICD-10-CM | POA: Diagnosis not present

## 2020-06-23 LAB — URINALYSIS, ROUTINE W REFLEX MICROSCOPIC
Bilirubin Urine: NEGATIVE
Glucose, UA: NEGATIVE mg/dL
Ketones, ur: NEGATIVE mg/dL
Nitrite: NEGATIVE
Protein, ur: 100 mg/dL — AB
RBC / HPF: >50 RBC/hpf — ABNORMAL HIGH (ref 0–5)
Specific Gravity, Urine: 1.029 (ref 1.005–1.030)
pH: 5 (ref 5.0–8.0)

## 2020-06-23 NOTE — ED Provider Notes (Signed)
[facility] EMERGENCY DEPARTMENT Provider Note   CSN: [id number] Arrival date & time: 06/22/20  2023     History Chief Complaint  Patient presents with  . Vaginal Bleeding    Linda Burnett is a 34 y.o. female.   Vaginal Bleeding Quality:  Bright red Severity:  Moderate Onset quality:  Gradual Timing:  Constant Progression:  Worsening Chronicity:  New Menstrual history:  Regular Possible pregnancy: no   Relieved by:  Nothing Worsened by:  Nothing Ineffective treatments:  None tried      Past Medical History:  Diagnosis Date  . Kidney stone   . Polycystic ovary syndrome   . Prediabetes   . Tubular adenoma 2017    Patient Active Problem List   Diagnosis Date Noted  . Postpartum care following cesarean delivery (3/10) 03/09/2020  . Encounter for planned induction of labor 03/08/2020  . GERD (gastroesophageal reflux disease) 06/15/2016  . Abdominal pain, right upper quadrant 06/15/2016  . History of colonic polyps   . Anovulation 08/29/2014  . Absence of menstruation 03/08/2014    Past Surgical History:  Procedure Laterality Date  . CESAREAN SECTION N/A 03/08/2020   Procedure: CESAREAN SECTION;  Surgeon: Brien Few, MD;  Location: [facility] LD ORS;  Service: Obstetrics;  Laterality: N/A;  . COLONOSCOPY N/A 04/05/2016   Dr.Rourk- 2-70mm polyps at the hepatic flexure, one 7mm polyp in the descending colon, the examined portion of the ileum was normal, the examination was o/w normal on direct and retroflexion views. bx= tubular adenoma and hyperplastic polyp.  . DENTAL SURGERY    . TONSILLECTOMY    . UTERINE SEPTUM RESECTION     "took a uterine septum out before IVF"     OB History    Gravida  2   Para  1   Term  1   Preterm      AB  1   Living  1     SAB  1   TAB      Ectopic      Multiple  0   Live Births  1           Family History  Problem Relation Age of Onset  . Colon cancer Father   . Diabetes Maternal  Grandmother   . Diabetes Paternal Grandmother   . Colon cancer Paternal Grandmother   . Heart attack Maternal Uncle        Died age 45     Social History   Tobacco Use  . Smoking status: Former Smoker    Types: Cigarettes    Quit date: 08/21/2015    Years since quitting: 4.8  . Smokeless tobacco: Never Used  Vaping Use  . Vaping Use: Never used  Substance Use Topics  . Alcohol use: No    Alcohol/week: 0.0 standard drinks  . Drug use: No    Home Medications Prior to Admission medications   Medication Sig Start Date End Date Taking? Authorizing Provider  ferrous sulfate (FERROUSUL) 325 (65 FE) MG tablet Take 1 tablet (325 mg total) by mouth daily with breakfast. 03/11/20 06/23/20 Yes Azucena Fallen, MD  acetaminophen (TYLENOL) 500 MG tablet Take 1 tablet (500 mg total) by mouth every 6 (six) hours as needed. Patient not taking: Reported on 06/23/2020 03/11/20   Azucena Fallen, MD  ibuprofen (ADVIL) 800 MG tablet Take 1 tablet (800 mg total) by mouth every 6 (six) hours. Patient not taking: Reported on 06/23/2020 03/11/20   Azucena Fallen, MD  Allergies    Patient has no known allergies.  Review of Systems   Review of Systems  Genitourinary: Positive for vaginal bleeding.  All other systems reviewed and are negative.   Physical Exam Updated Vital Signs BP 121/79   Pulse 67   Temp 98.5 F (36.9 C) (Oral)   Resp 20   Ht 5\' 5"  (1.651 m)   Wt 95.7 kg   SpO2 100%   BMI 35.11 kg/m   Physical Exam Vitals and nursing note reviewed.  Constitutional:      Appearance: She is well-developed.  HENT:     Head: Normocephalic and atraumatic.     Mouth/Throat:     Mouth: Mucous membranes are moist.     Pharynx: Oropharynx is clear.  Cardiovascular:     Rate and Rhythm: Normal rate and regular rhythm.  Pulmonary:     Effort: No respiratory distress.     Breath sounds: No stridor.  Abdominal:     General: There is no distension.  Genitourinary:    Comments: Chaperoned by  nurse, Julien.  Blood in vault.  Closed os No active bleeding No obvious lacerations or other injuries  Musculoskeletal:     Cervical back: Normal range of motion.  Skin:    General: Skin is warm and dry.  Neurological:     General: No focal deficit present.     Mental Status: She is alert.     ED Results / Procedures / Treatments   Labs (all labs ordered are listed, but only abnormal results are displayed) Labs Reviewed  CBC - Abnormal; Notable for the following components:      Result Value   Hemoglobin 11.8 (*)    All other components within normal limits  URINALYSIS, ROUTINE W REFLEX MICROSCOPIC - Abnormal; Notable for the following components:   APPearance CLOUDY (*)    Hgb urine dipstick LARGE (*)    Protein, ur 100 (*)    Leukocytes,Ua TRACE (*)    RBC / HPF >50 (*)    Bacteria, UA MANY (*)    All other components within normal limits  URINE CULTURE  I-STAT BETA HCG BLOOD, ED ([facility], [facility], [facility] ONLY)    EKG None  Radiology No results found.  Procedures Procedures (including critical care time)  Medications Ordered in ED Medications - No data to display  ED Course  I have reviewed the triage vital signs and the nursing notes.  Pertinent labs & imaging results that were available during my care of the patient were reviewed by me and considered in my medical decision making (see chart for details).    MDM Rules/Calculators/A&P                          Increased vaginal bleeding in the hospital at the time of her normal menstrual cycle after her pregnancy.  I offered her OCPs however she prefers to talk to her gynecologist about it.  She has no vital signs or physical exam findings or history findings consistent with hemorrhagic anemia.  Hemoglobin stable.  Stable for discharge  Final Clinical Impression(s) / ED Diagnoses Final diagnoses:  Vaginal bleeding    Rx / DC Orders ED Discharge Orders    None       Lux Skilton, Corene Cornea, MD 06/23/20 [phone]

## 2020-06-25 LAB — URINE CULTURE: Culture: 40000 — AB

## 2020-06-26 ENCOUNTER — Telehealth: Payer: Self-pay

## 2020-06-26 NOTE — Telephone Encounter (Signed)
No treatment for [facility] ED 06/23/20 per Georgiann Hahn Pharm D

## 2020-08-08 ENCOUNTER — Other Ambulatory Visit: Payer: Self-pay

## 2020-08-08 ENCOUNTER — Emergency Department (HOSPITAL_COMMUNITY): Payer: BC Managed Care – PPO

## 2020-08-08 ENCOUNTER — Encounter (HOSPITAL_COMMUNITY): Payer: Self-pay

## 2020-08-08 ENCOUNTER — Emergency Department (HOSPITAL_COMMUNITY)
Admission: EM | Admit: 2020-08-08 | Discharge: 2020-08-08 | Disposition: A | Discharge status: Home / Self Care | Payer: BC Managed Care – PPO | Attending: Emergency Medicine | Admitting: Emergency Medicine

## 2020-08-08 DIAGNOSIS — M549 Dorsalgia, unspecified: Secondary | ICD-10-CM | POA: Diagnosis not present

## 2020-08-08 DIAGNOSIS — K8051 Calculus of bile duct without cholangitis or cholecystitis with obstruction: Secondary | ICD-10-CM | POA: Insufficient documentation

## 2020-08-08 DIAGNOSIS — R1011 Right upper quadrant pain: Secondary | ICD-10-CM | POA: Diagnosis not present

## 2020-08-08 DIAGNOSIS — K8050 Calculus of bile duct without cholangitis or cholecystitis without obstruction: Secondary | ICD-10-CM

## 2020-08-08 DIAGNOSIS — Z87891 Personal history of nicotine dependence: Secondary | ICD-10-CM | POA: Diagnosis not present

## 2020-08-08 DIAGNOSIS — K8020 Calculus of gallbladder without cholecystitis without obstruction: Secondary | ICD-10-CM | POA: Diagnosis not present

## 2020-08-08 DIAGNOSIS — R109 Unspecified abdominal pain: Secondary | ICD-10-CM | POA: Diagnosis not present

## 2020-08-08 LAB — CBC
HCT: 42.2 % (ref 36.0–46.0)
Hemoglobin: 13 g/dL (ref 12.0–15.0)
MCH: 27 pg (ref 26.0–34.0)
MCHC: 30.8 g/dL (ref 30.0–36.0)
MCV: 87.6 fL (ref 80.0–100.0)
Platelets: 361 10*3/uL (ref 150–400)
RBC: 4.82 MIL/uL (ref 3.87–5.11)
RDW: 14 % (ref 11.5–15.5)
WBC: 9 10*3/uL (ref 4.0–10.5)
nRBC: 0 % (ref 0.0–0.2)

## 2020-08-08 LAB — URINALYSIS, ROUTINE W REFLEX MICROSCOPIC
Bilirubin Urine: NEGATIVE
Glucose, UA: NEGATIVE mg/dL
Hgb urine dipstick: NEGATIVE
Ketones, ur: NEGATIVE mg/dL
Leukocytes,Ua: NEGATIVE
Nitrite: NEGATIVE
Protein, ur: NEGATIVE mg/dL
Specific Gravity, Urine: 1.016 (ref 1.005–1.030)
pH: 6 (ref 5.0–8.0)

## 2020-08-08 LAB — COMPREHENSIVE METABOLIC PANEL
ALT: 39 U/L (ref 0–44)
AST: 37 U/L (ref 15–41)
Albumin: 4.2 g/dL (ref 3.5–5.0)
Alkaline Phosphatase: 85 U/L (ref 38–126)
Anion gap: 10 (ref 5–15)
BUN: 14 mg/dL (ref 6–20)
CO2: 24 mmol/L (ref 22–32)
Calcium: 9.2 mg/dL (ref 8.9–10.3)
Chloride: 106 mmol/L (ref 98–111)
Creatinine, Ser: 0.72 mg/dL (ref 0.44–1.00)
GFR calc Af Amer: >60 mL/min (ref >60)
GFR calc non Af Amer: >60 mL/min (ref >60)
Glucose, Bld: 120 mg/dL — ABNORMAL HIGH (ref 70–99)
Potassium: 4.9 mmol/L (ref 3.5–5.1)
Sodium: 140 mmol/L (ref 135–145)
Total Bilirubin: 0.6 mg/dL (ref 0.3–1.2)
Total Protein: 7.8 g/dL (ref 6.5–8.1)

## 2020-08-08 LAB — LIPASE, BLOOD: Lipase: 25 U/L (ref 11–51)

## 2020-08-08 LAB — PREGNANCY, URINE: Preg Test, Ur: NEGATIVE

## 2020-08-08 MED ORDER — ONDANSETRON 4 MG PO TBDP: 4.0000 mg | ORAL_TABLET | Freq: Three times a day (TID) | ORAL | 0 refills | Status: DC | PRN

## 2020-08-08 NOTE — ED Provider Notes (Signed)
[facility] EMERGENCY DEPARTMENT Provider Note   CSN: [id number] Arrival date & time: 08/08/20  5009     History Chief Complaint  Patient presents with  . Abdominal Pain    Linda Burnett is a 34 y.o. female.  She has no significant past medical history.  She is complaining of a week of on and off right upper quadrant subxiphoid abdominal pain with some associated nausea.  It became more constant starting at 3 AM and lasted for hours but has now resolved again.  No vomiting.  No fevers chills diarrhea or urinary symptoms.  Prior history of cholecystectomy.  No other abdominal surgeries.  The history is provided by the patient.  Abdominal Pain Pain location:  RUQ Pain quality: cramping   Pain radiates to:  Back Pain severity:  Severe Onset quality:  Gradual Duration:  1 week Timing:  Intermittent Progression:  Resolved Chronicity:  New Context: not trauma   Relieved by:  Nothing Worsened by:  Nothing Ineffective treatments:  None tried Associated symptoms: nausea   Associated symptoms: no chest pain, no chills, no cough, no diarrhea, no dysuria, no fever, no shortness of breath, no sore throat and no vomiting        Past Medical History:  Diagnosis Date  . Kidney stone   . Polycystic ovary syndrome   . Prediabetes   . Tubular adenoma 2017    Patient Active Problem List   Diagnosis Date Noted  . Postpartum care following cesarean delivery (3/10) 03/09/2020  . Encounter for planned induction of labor 03/08/2020  . GERD (gastroesophageal reflux disease) 06/15/2016  . Abdominal pain, right upper quadrant 06/15/2016  . History of colonic polyps   . Anovulation 08/29/2014  . Absence of menstruation 03/08/2014    Past Surgical History:  Procedure Laterality Date  . CESAREAN SECTION N/A 03/08/2020   Procedure: CESAREAN SECTION;  Surgeon: Brien Few, MD;  Location: [facility];  Service: Obstetrics;  Laterality: N/A;  . COLONOSCOPY N/A 04/05/2016   Dr.Rourk- 2-67mm  polyps at the hepatic flexure, one 11mm polyp in the descending colon, the examined portion of the ileum was normal, the examination was o/w normal on direct and retroflexion views. bx= tubular adenoma and hyperplastic polyp.  . DENTAL SURGERY    . TONSILLECTOMY    . UTERINE SEPTUM RESECTION     "took a uterine septum out before IVF"     OB History    Gravida  2   Para  1   Term  1   Preterm      AB  1   Living  1     SAB  1   TAB      Ectopic      Multiple  0   Live Births  1           Family History  Problem Relation Age of Onset  . Colon cancer Father   . Diabetes Maternal Grandmother   . Diabetes Paternal Grandmother   . Colon cancer Paternal Grandmother   . Heart attack Maternal Uncle        Died age 15     Social History   Tobacco Use  . Smoking status: Former Smoker    Types: Cigarettes    Quit date: 08/21/2015    Years since quitting: 4.9  . Smokeless tobacco: Never Used  Vaping Use  . Vaping Use: Never used  Substance Use Topics  . Alcohol use: No    Alcohol/week: 0.0 standard  drinks  . Drug use: No    Home Medications Prior to Admission medications   Medication Sig Start Date End Date Taking? Authorizing Provider  acetaminophen (TYLENOL) 500 MG tablet Take 1 tablet (500 mg total) by mouth every 6 (six) hours as needed. Patient not taking: Reported on 06/23/2020 03/11/20   Azucena Fallen, MD  ferrous sulfate (FERROUSUL) 325 (65 FE) MG tablet Take 1 tablet (325 mg total) by mouth daily with breakfast. 03/11/20 06/23/20  Azucena Fallen, MD  ibuprofen (ADVIL) 800 MG tablet Take 1 tablet (800 mg total) by mouth every 6 (six) hours. Patient not taking: Reported on 06/23/2020 03/11/20   Azucena Fallen, MD    Allergies    Patient has no known allergies.  Review of Systems   Review of Systems  Constitutional: Negative for chills and fever.  HENT: Negative for sore throat.   Eyes: Negative for visual disturbance.  Respiratory: Negative for cough  and shortness of breath.   Cardiovascular: Negative for chest pain.  Gastrointestinal: Positive for abdominal pain and nausea. Negative for diarrhea and vomiting.  Genitourinary: Negative for dysuria.  Musculoskeletal: Positive for back pain.  Skin: Negative for rash.  Neurological: Negative for headaches.    Physical Exam Updated Vital Signs BP 114/84 (BP Location: Right Arm)   Pulse 70   Temp 98.3 F (36.8 C) (Oral)   Resp 18   Ht 5\' 5"  (1.651 m)   Wt 95.7 kg   LMP 06/08/2020   SpO2 99%   Breastfeeding No   BMI 35.11 kg/m   Physical Exam Vitals and nursing note reviewed.  Constitutional:      General: She is not in acute distress.    Appearance: Normal appearance. She is well-developed.  HENT:     Head: Normocephalic and atraumatic.  Eyes:     Conjunctiva/sclera: Conjunctivae normal.  Cardiovascular:     Rate and Rhythm: Normal rate and regular rhythm.     Heart sounds: No murmur heard.   Pulmonary:     Effort: Pulmonary effort is normal. No respiratory distress.     Breath sounds: Normal breath sounds.  Abdominal:     Palpations: Abdomen is soft.     Tenderness: There is abdominal tenderness in the right upper quadrant. There is no guarding or rebound.  Musculoskeletal:        General: Normal range of motion.     Cervical back: Neck supple.     Right lower leg: No edema.     Left lower leg: No edema.  Skin:    General: Skin is warm and dry.     Capillary Refill: Capillary refill takes less than 2 seconds.  Neurological:     General: No focal deficit present.     Mental Status: She is alert.     ED Results / Procedures / Treatments   Labs (all labs ordered are listed, but only abnormal results are displayed) Labs Reviewed  COMPREHENSIVE METABOLIC PANEL - Abnormal; Notable for the following components:      Result Value   Glucose, Bld 120 (*)    All other components within normal limits  URINALYSIS, ROUTINE W REFLEX MICROSCOPIC - Abnormal; Notable for  the following components:   APPearance HAZY (*)    All other components within normal limits  LIPASE, BLOOD  CBC  PREGNANCY, URINE    EKG EKG Interpretation  Date/Time:  Tuesday August 08 2020 07:33:35 EDT Ventricular Rate:  78 PR Interval:  136 QRS Duration: 88 QT Interval:  376 QTC Calculation: 428 R Axis:   54 Text Interpretation: Normal sinus rhythm with sinus arrhythmia Inferior infarct , age undetermined Cannot rule out Anterior infarct , age undetermined Abnormal ECG No significant change since prior 2/12 Confirmed by Aletta Edouard [phone]) on 08/08/2020 7:39:39 AM   Radiology US Abdomen Limited RUQ  Result Date: 08/08/2020 CLINICAL DATA:  Right upper quadrant pain for 1 week EXAM: ULTRASOUND ABDOMEN LIMITED RIGHT UPPER QUADRANT COMPARISON:  None. FINDINGS: Gallbladder: Numerous small cholelithiasis layering dependently. No pericholecystic fluid. No gallbladder wall thickening. Negative sonographic Murphy sign. Common bile duct: Diameter: 3.5 mm Liver: No focal lesion identified. Increased hepatic parenchymal echogenicity. Portal vein is patent on color Doppler imaging with normal direction of blood flow towards the liver. Other: None. IMPRESSION: 1. Cholelithiasis without sonographic evidence of acute cholecystitis. 2. Increased hepatic echogenicity as can be seen with hepatic steatosis. Electronically Signed   By: Kathreen Devoid   On: 08/08/2020 12:58    Procedures Procedures (including critical care time)  Medications Ordered in ED Medications - No data to display  ED Course  I have reviewed the triage vital signs and the nursing notes.  Pertinent labs & imaging results that were available during my care of the patient were reviewed by me and considered in my medical decision making (see chart for details).  Clinical Course as of Aug 08 1758  Tue Aug 08, 2020  1324 Reviewed work-up with Dr. Arnoldo Morale.  He felt she was appropriate for outpatient follow-up.  Reviewed this  with patient.  She is okay with that and we had discussion of return instructions.   [MB]    Clinical Course User Index [MB] Hayden Rasmussen, MD   MDM Rules/Calculators/A&P                         This patient complains of upper abdominal pain radiating into back; this involves an extensive number of treatment Options and is a complaint that carries with it a high risk of complications and Morbidity. The differential includes peptic ulcer disease, GERD, cholelithiasis, cholecystitis, vascular.   I ordered, reviewed and interpreted labs, which included CBC with normal white count normal hemoglobin, chemistries normal other than mildly elevated glucose, normal LFTs, pregnancy test negative, urinalysis without obvious signs of infection I ordered imaging studies which included right upper quadrant ultrasound and I independently    visualized and interpreted imaging which showed cholelithiasis, no cholecystitis  Previous records obtained and reviewed in epic, no recent visits I consulted general surgery Dr. Arnoldo Morale and discussed lab and imaging findings  Critical Interventions: None  After the interventions stated above, I reevaluated the patient and found patient to be pain-free.  Reviewed lab work and imaging findings with her along with general surgery recommendations.  She is comfortable with plan for home and outpatient follow-up.  Clear return instructions discussed.   Final Clinical Impression(s) / ED Diagnoses Final diagnoses:  RUQ abdominal pain  Biliary colic    Rx / DC Orders ED Discharge Orders         Ordered    ondansetron (ZOFRAN-ODT) 4 MG disintegrating tablet  Every 8 hours PRN     Discontinue  Reprint     08/08/20 1325           Hayden Rasmussen, MD 08/08/20 1801

## 2020-08-08 NOTE — ED Triage Notes (Signed)
Pt reports intermittent ruq pain that radiates into chest for the past week.  Reports nausea but no vomiting.

## 2020-08-08 NOTE — Discharge Instructions (Addendum)
You were seen in the emergency department for 1 week of upper abdominal pain and associated nausea.  Your lab work and urinalysis were unremarkable.  Your ultrasound showed that you have gallstones in your gallbladder.  This is likely the source of your pain.  Please follow-up with general surgery Dr. Arnoldo Morale.  Low-fat diet.  If you experiencing unrelenting pain or fevers you should return to the emergency department.

## 2020-08-22 ENCOUNTER — Other Ambulatory Visit: Payer: Self-pay

## 2020-08-22 ENCOUNTER — Ambulatory Visit: Payer: PRIVATE HEALTH INSURANCE | Admitting: General Surgery

## 2020-08-22 ENCOUNTER — Ambulatory Visit (INDEPENDENT_AMBULATORY_CARE_PROVIDER_SITE_OTHER): Payer: BC Managed Care – PPO | Admitting: General Surgery

## 2020-08-22 ENCOUNTER — Encounter: Payer: Self-pay | Admitting: General Surgery

## 2020-08-22 VITALS — BP 111/76 | HR 63 | Temp 98.1°F | Resp 12 | Ht 65.0 in | Wt 240.0 lb

## 2020-08-22 DIAGNOSIS — K8020 Calculus of gallbladder without cholecystitis without obstruction: Secondary | ICD-10-CM | POA: Diagnosis not present

## 2020-08-22 NOTE — H&P (Signed)
Linda Burnett; [id number]; 03-Feb-1986   HPI Patient is a 34 year old white female who was referred to my care by the emergency room for evaluation and treatment of right upper quadrant abdominal pain.  Patient was recently seen in the emergency room for right upper quadrant abdominal pain and nausea.  An ultrasound the gallbladder revealed cholelithiasis.  The common bile duct was within normal limits.  Her LFTs were within normal limits.  She was discharged home from the emergency room and told to follow-up with me.  Since that time, she states she intermittently has right upper quadrant abdominal pain and nausea.  She has been trying to adjust her diet to avoid those trigger foods that cause her symptoms.  She states this is been going on for multiple weeks.  She denies having this issue during her recent pregnancy.  She denies any fever, chills, jaundice. Past Medical History:  Diagnosis Date  . Kidney stone   . Polycystic ovary syndrome   . Prediabetes   . Tubular adenoma 2017    Past Surgical History:  Procedure Laterality Date  . CESAREAN SECTION N/A 03/08/2020   Procedure: CESAREAN SECTION;  Surgeon: Brien Few, MD;  Location: [facility];  Service: Obstetrics;  Laterality: N/A;  . COLONOSCOPY N/A 04/05/2016   Dr.Rourk- 2-40mm polyps at the hepatic flexure, one 57mm polyp in the descending colon, the examined portion of the ileum was normal, the examination was o/w normal on direct and retroflexion views. bx= tubular adenoma and hyperplastic polyp.  . DENTAL SURGERY    . TONSILLECTOMY    . UTERINE SEPTUM RESECTION     "took a uterine septum out before IVF"    Family History  Problem Relation Age of Onset  . Colon cancer Father   . Diabetes Maternal Grandmother   . Diabetes Paternal Grandmother   . Colon cancer Paternal Grandmother   . Heart attack Maternal Uncle        Died age 28     Current Outpatient Medications on File Prior to Visit  Medication Sig Dispense Refill  .  acetaminophen (TYLENOL) 500 MG tablet Take 1 tablet (500 mg total) by mouth every 6 (six) hours as needed. 30 tablet 0  . ibuprofen (ADVIL) 800 MG tablet Take 1 tablet (800 mg total) by mouth every 6 (six) hours. 30 tablet 0  . ondansetron (ZOFRAN-ODT) 4 MG disintegrating tablet Take 1 tablet (4 mg total) by mouth every 8 (eight) hours as needed for nausea or vomiting. 20 tablet 0  . ferrous sulfate (FERROUSUL) 325 (65 FE) MG tablet Take 1 tablet (325 mg total) by mouth daily with breakfast. 30 tablet 0   No current facility-administered medications on file prior to visit.    No Known Allergies  Social History   Substance and Sexual Activity  Alcohol Use No  . Alcohol/week: 0.0 standard drinks    Social History   Tobacco Use  Smoking Status Former Smoker  . Types: Cigarettes  . Quit date: 08/21/2015  . Years since quitting: 5.0  Smokeless Tobacco Never Used    Review of Systems  Constitutional: Negative.   HENT: Negative.   Eyes: Negative.   Respiratory: Negative.   Cardiovascular: Negative.   Gastrointestinal: Positive for abdominal pain, heartburn and nausea.  Genitourinary: Negative.   Musculoskeletal: Negative.   Skin: Negative.   Neurological: Negative.   Endo/Heme/Allergies: Negative.   Psychiatric/Behavioral: Negative.     Objective   Vitals:   08/22/20 1308  BP: 111/76  Pulse: 63  Resp: 12  Temp: 98.1 F (36.7 C)  SpO2: 96%    Physical Exam Vitals reviewed.  Constitutional:      Appearance: Normal appearance. She is not ill-appearing.  Eyes:     General: No scleral icterus. Cardiovascular:     Rate and Rhythm: Normal rate and regular rhythm.     Heart sounds: Normal heart sounds. No murmur heard.  No friction rub. No gallop.   Pulmonary:     Effort: Pulmonary effort is normal. No respiratory distress.     Breath sounds: Normal breath sounds. No stridor. No wheezing, rhonchi or rales.  Abdominal:     General: Bowel sounds are normal. There is  no distension.     Palpations: Abdomen is soft. There is no mass.     Tenderness: There is abdominal tenderness. There is no guarding or rebound.     Hernia: No hernia is present.     Comments: Slight discomfort to deep palpation in the right upper quadrant.  Skin:    General: Skin is warm and dry.  Neurological:     Mental Status: She is alert and oriented to person, place, and time.   ER notes reviewed  Assessment  Biliary colic, cholelithiasis Plan   Patient is scheduled for laparoscopic cholecystectomy on 09/06/2020.  The risks and benefits of the procedure including bleeding, infection, hepatobiliary injury, and the possibility of an open procedure were fully explained to the patient, who gave informed consent.

## 2020-08-22 NOTE — Patient Instructions (Signed)
Laparoscopic Cholecystectomy Laparoscopic cholecystectomy is surgery to remove the gallbladder. The gallbladder is a pear-shaped organ that lies beneath the liver on the right side of the body. The gallbladder stores bile, which is a fluid that helps the body to digest fats. Cholecystectomy is often done for inflammation of the gallbladder (cholecystitis). This condition is usually caused by a buildup of gallstones (cholelithiasis) in the gallbladder. Gallstones can block the flow of bile, which can result in inflammation and pain. In severe cases, emergency surgery may be required. This procedure is done though small incisions in your abdomen (laparoscopic surgery). A thin scope with a camera (laparoscope) is inserted through one incision. Thin surgical instruments are inserted through the other incisions. In some cases, a laparoscopic procedure may be turned into a type of surgery that is done through a larger incision (open surgery). Tell a health care provider about:  Any allergies you have.  All medicines you are taking, including vitamins, herbs, eye drops, creams, and over-the-counter medicines.  Any problems you or family members have had with anesthetic medicines.  Any blood disorders you have.  Any surgeries you have had.  Any medical conditions you have.  Whether you are pregnant or may be pregnant. What are the risks? Generally, this is a safe procedure. However, problems may occur, including:  Infection.  Bleeding.  Allergic reactions to medicines.  Damage to other structures or organs.  A stone remaining in the common bile duct. The common bile duct carries bile from the gallbladder into the small intestine.  A bile leak from the cyst duct that is clipped when your gallbladder is removed. What happens before the procedure?   Medicines  Ask your health care provider about: ? Changing or stopping your regular medicines. This is especially important if you are taking  diabetes medicines or blood thinners. ? Taking medicines such as aspirin and ibuprofen. These medicines can thin your blood. Do not take these medicines before your procedure if your health care provider instructs you not to.  You may be given antibiotic medicine to help prevent infection. General instructions  Let your health care provider know if you develop a cold or an infection before surgery.  Plan to have someone take you home from the hospital or clinic.  Ask your health care provider how your surgical site will be marked or identified. What happens during the procedure?   To reduce your risk of infection: ? Your health care team will wash or sanitize their hands. ? Your skin will be washed with soap. ? Hair may be removed from the surgical area.  An IV tube may be inserted into one of your veins.  You will be given one or more of the following: ? A medicine to help you relax (sedative). ? A medicine to make you fall asleep (general anesthetic).  A breathing tube will be placed in your mouth.  Your surgeon will make several small cuts (incisions) in your abdomen.  The laparoscope will be inserted through one of the small incisions. The camera on the laparoscope will send images to a TV screen (monitor) in the operating room. This lets your surgeon see inside your abdomen.  Air-like gas will be pumped into your abdomen. This will expand your abdomen to give the surgeon more room to perform the surgery.  Other tools that are needed for the procedure will be inserted through the other incisions. The gallbladder will be removed through one of the incisions.  Your common bile duct   may be examined. If stones are found in the common bile duct, they may be removed.  After your gallbladder has been removed, the incisions will be closed with stitches (sutures), staples, or skin glue.  Your incisions may be covered with a bandage (dressing). The procedure may vary among health  care providers and hospitals. What happens after the procedure?  Your blood pressure, heart rate, breathing rate, and blood oxygen level will be monitored until the medicines you were given have worn off.  You will be given medicines as needed to control your pain.  Do not drive for 24 hours if you were given a sedative. This information is not intended to replace advice given to you by your health care provider. Make sure you discuss any questions you have with your health care provider. Document Revised: 11/28/2017 Document Reviewed: 06/03/2016 Elsevier Patient Education  2020 [employer]  

## 2020-08-22 NOTE — Progress Notes (Signed)
Linda Burnett; [id number]; 07-Dec-1986   HPI Patient is a 34 year old white female who was referred to my care by the emergency room for evaluation and treatment of right upper quadrant abdominal pain.  Patient was recently seen in the emergency room for right upper quadrant abdominal pain and nausea.  An ultrasound the gallbladder revealed cholelithiasis.  The common bile duct was within normal limits.  Her LFTs were within normal limits.  She was discharged home from the emergency room and told to follow-up with me.  Since that time, she states she intermittently has right upper quadrant abdominal pain and nausea.  She has been trying to adjust her diet to avoid those trigger foods that cause her symptoms.  She states this is been going on for multiple weeks.  She denies having this issue during her recent pregnancy.  She denies any fever, chills, jaundice.  She is not breast-feeding. Past Medical History:  Diagnosis Date  . Kidney stone   . Polycystic ovary syndrome   . Prediabetes   . Tubular adenoma 2017    Past Surgical History:  Procedure Laterality Date  . CESAREAN SECTION N/A 03/08/2020   Procedure: CESAREAN SECTION;  Surgeon: Brien Few, MD;  Location: [facility];  Service: Obstetrics;  Laterality: N/A;  . COLONOSCOPY N/A 04/05/2016   Dr.Rourk- 2-68mm polyps at the hepatic flexure, one 6mm polyp in the descending colon, the examined portion of the ileum was normal, the examination was o/w normal on direct and retroflexion views. bx= tubular adenoma and hyperplastic polyp.  . DENTAL SURGERY    . TONSILLECTOMY    . UTERINE SEPTUM RESECTION     "took a uterine septum out before IVF"    Family History  Problem Relation Age of Onset  . Colon cancer Father   . Diabetes Maternal Grandmother   . Diabetes Paternal Grandmother   . Colon cancer Paternal Grandmother   . Heart attack Maternal Uncle        Died age 80     Current Outpatient Medications on File Prior to Visit   Medication Sig Dispense Refill  . acetaminophen (TYLENOL) 500 MG tablet Take 1 tablet (500 mg total) by mouth every 6 (six) hours as needed. 30 tablet 0  . ibuprofen (ADVIL) 800 MG tablet Take 1 tablet (800 mg total) by mouth every 6 (six) hours. 30 tablet 0  . ondansetron (ZOFRAN-ODT) 4 MG disintegrating tablet Take 1 tablet (4 mg total) by mouth every 8 (eight) hours as needed for nausea or vomiting. 20 tablet 0  . ferrous sulfate (FERROUSUL) 325 (65 FE) MG tablet Take 1 tablet (325 mg total) by mouth daily with breakfast. 30 tablet 0   No current facility-administered medications on file prior to visit.    No Known Allergies  Social History   Substance and Sexual Activity  Alcohol Use No  . Alcohol/week: 0.0 standard drinks    Social History   Tobacco Use  Smoking Status Former Smoker  . Types: Cigarettes  . Quit date: 08/21/2015  . Years since quitting: 5.0  Smokeless Tobacco Never Used    Review of Systems  Constitutional: Negative.   HENT: Negative.   Eyes: Negative.   Respiratory: Negative.   Cardiovascular: Negative.   Gastrointestinal: Positive for abdominal pain, heartburn and nausea.  Genitourinary: Negative.   Musculoskeletal: Negative.   Skin: Negative.   Neurological: Negative.   Endo/Heme/Allergies: Negative.   Psychiatric/Behavioral: Negative.     Objective   Vitals:   08/22/20 1308  BP: 111/76  Pulse: 63  Resp: 12  Temp: 98.1 F (36.7 C)  SpO2: 96%    Physical Exam Vitals reviewed.  Constitutional:      Appearance: Normal appearance. She is not ill-appearing.  Eyes:     General: No scleral icterus. Cardiovascular:     Rate and Rhythm: Normal rate and regular rhythm.     Heart sounds: Normal heart sounds. No murmur heard.  No friction rub. No gallop.   Pulmonary:     Effort: Pulmonary effort is normal. No respiratory distress.     Breath sounds: Normal breath sounds. No stridor. No wheezing, rhonchi or rales.  Abdominal:      General: Bowel sounds are normal. There is no distension.     Palpations: Abdomen is soft. There is no mass.     Tenderness: There is abdominal tenderness. There is no guarding or rebound.     Hernia: No hernia is present.     Comments: Slight discomfort to deep palpation in the right upper quadrant.  Skin:    General: Skin is warm and dry.  Neurological:     Mental Status: She is alert and oriented to person, place, and time.   ER notes reviewed  Assessment  Biliary colic, cholelithiasis Plan   Patient is scheduled for laparoscopic cholecystectomy on 09/06/2020.  The risks and benefits of the procedure including bleeding, infection, hepatobiliary injury, and the possibility of an open procedure were fully explained to the patient, who gave informed consent.

## 2020-08-24 NOTE — Patient Instructions (Signed)
Linda Burnett  08/24/2020     @PREFPERIOPPHARMACY @   Your procedure is scheduled on 08/28/2020.  Report to [facility] at  1110  A.M.  Call this number if you have problems the morning of surgery:  [phone]   Remember:  Do not eat or drink after midnight.                         Take these medicines the morning of surgery with A SIP OF WATER  zofran (if needed).    Do not wear jewelry, make-up or nail polish.  Do not wear lotions, powders, or perfumes. Please wear deodorant and brush your teeth.  Do not shave 48 hours prior to surgery.  Men may shave face and neck.  Do not bring valuables to the hospital.  [facility] is not responsible for any belongings or valuables.  Contacts, dentures or bridgework may not be worn into surgery.  Leave your suitcase in the car.  After surgery it may be brought to your room.  For patients admitted to the hospital, discharge time will be determined by your treatment team.  Patients discharged the day of surgery will not be allowed to drive home.   Name and phone number of your driver:   family Special instructions:  DO NOT smoke the morning of your procedure.  Please read over the following fact sheets that you were given. Anesthesia Post-op Instructions and Care and Recovery After Surgery       Laparoscopic Cholecystectomy, Care After This sheet gives you information about how to care for yourself after your procedure. Your health care provider may also give you more specific instructions. If you have problems or questions, contact your health care provider. What can I expect after the procedure? After the procedure, it is common to have:  Pain at your incision sites. You will be given medicines to control this pain.  Mild nausea or vomiting.  Bloating and possible shoulder pain from the air-like gas that was used during the procedure. Follow these instructions at home: Incision care   Follow instructions from your  health care provider about how to take care of your incisions. Make sure you: ? Wash your hands with soap and water before you change your bandage (dressing). If soap and water are not available, use hand sanitizer. ? Change your dressing as told by your health care provider. ? Leave stitches (sutures), skin glue, or adhesive strips in place. These skin closures may need to be in place for 2 weeks or longer. If adhesive strip edges start to loosen and curl up, you may trim the loose edges. Do not remove adhesive strips completely unless your health care provider tells you to do that.  Do not take baths, swim, or use a hot tub until your health care provider approves. Ask your health care provider if you can take showers. You may only be allowed to take sponge baths for bathing.  Check your incision area every day for signs of infection. Check for: ? More redness, swelling, or pain. ? More fluid or blood. ? Warmth. ? Pus or a bad smell. Activity  Do not drive or use heavy machinery while taking prescription pain medicine.  Do not lift anything that is heavier than 10 lb (4.5 kg) until your health care provider approves.  Do not play contact sports until your health care provider approves.  Do not drive for 24 hours  if you were given a medicine to help you relax (sedative).  Rest as needed. Do not return to work or school until your health care provider approves. General instructions  Take over-the-counter and prescription medicines only as told by your health care provider.  To prevent or treat constipation while you are taking prescription pain medicine, your health care provider may recommend that you: ? Drink enough fluid to keep your urine clear or pale yellow. ? Take over-the-counter or prescription medicines. ? Eat foods that are high in fiber, such as fresh fruits and vegetables, whole grains, and beans. ? Limit foods that are high in fat and processed sugars, such as fried and  sweet foods. Contact a health care provider if:  You develop a rash.  You have more redness, swelling, or pain around your incisions.  You have more fluid or blood coming from your incisions.  Your incisions feel warm to the touch.  You have pus or a bad smell coming from your incisions.  You have a fever.  One or more of your incisions breaks open. Get help right away if:  You have trouble breathing.  You have chest pain.  You have increasing pain in your shoulders.  You faint or feel dizzy when you stand.  You have severe pain in your abdomen.  You have nausea or vomiting that lasts for more than one day.  You have leg pain. This information is not intended to replace advice given to you by your health care provider. Make sure you discuss any questions you have with your health care provider. Document Revised: 11/28/2017 Document Reviewed: 06/03/2016 Elsevier Patient Education  [place]. How to Use Chlorhexidine for Bathing Chlorhexidine gluconate (CHG) is a germ-killing (antiseptic) solution that is used to clean the skin. It can get rid of the bacteria that normally live on the skin and can keep them away for about 24 hours. To clean your skin with CHG, you may be given:  A CHG solution to use in the shower or as part of a sponge bath.  A prepackaged cloth that contains CHG. Cleaning your skin with CHG may help lower the risk for infection:  While you are staying in the intensive care unit of the hospital.  If you have a vascular access, such as a central line, to provide short-term or long-term access to your veins.  If you have a catheter to drain urine from your bladder.  If you are on a ventilator. A ventilator is a machine that helps you breathe by moving air in and out of your lungs.  After surgery. What are the risks? Risks of using CHG include:  A skin reaction.  Hearing loss, if CHG gets in your ears.  Eye injury, if CHG gets in your  eyes and is not rinsed out.  The CHG product catching fire. Make sure that you avoid smoking and flames after applying CHG to your skin. Do not use CHG:  If you have a chlorhexidine allergy or have previously reacted to chlorhexidine.  On babies younger than 10 months of age. How to use CHG solution  Use CHG only as told by your health care provider, and follow the instructions on the label.  Use the full amount of CHG as directed. Usually, this is one bottle. During a shower Follow these steps when using CHG solution during a shower (unless your health care provider gives you different instructions): 1. Start the shower. 2. Use your normal soap and  shampoo to wash your face and hair. 3. Turn off the shower or move out of the shower stream. 4. Pour the CHG onto a clean washcloth. Do not use any type of brush or rough-edged sponge. 5. Starting at your neck, lather your body down to your toes. Make sure you follow these instructions: ? If you will be having surgery, pay special attention to the part of your body where you will be having surgery. Scrub this area for at least 1 minute. ? Do not use CHG on your head or face. If the solution gets into your ears or eyes, rinse them well with water. ? Avoid your genital area. ? Avoid any areas of skin that have broken skin, cuts, or scrapes. ? Scrub your back and under your arms. Make sure to wash skin folds. 6. Let the lather sit on your skin for 1-2 minutes or as long as told by your health care provider. 7. Thoroughly rinse your entire body in the shower. Make sure that all body creases and crevices are rinsed well. 8. Dry off with a clean towel. Do not put any substances on your body afterward--such as powder, lotion, or perfume--unless you are told to do so by your health care provider. Only use lotions that are recommended by the manufacturer. 9. Put on clean clothes or pajamas. 10. If it is the night before your surgery, sleep in clean  sheets.  During a sponge bath Follow these steps when using CHG solution during a sponge bath (unless your health care provider gives you different instructions): 1. Use your normal soap and shampoo to wash your face and hair. 2. Pour the CHG onto a clean washcloth. 3. Starting at your neck, lather your body down to your toes. Make sure you follow these instructions: ? If you will be having surgery, pay special attention to the part of your body where you will be having surgery. Scrub this area for at least 1 minute. ? Do not use CHG on your head or face. If the solution gets into your ears or eyes, rinse them well with water. ? Avoid your genital area. ? Avoid any areas of skin that have broken skin, cuts, or scrapes. ? Scrub your back and under your arms. Make sure to wash skin folds. 4. Let the lather sit on your skin for 1-2 minutes or as long as told by your health care provider. 5. Using a different clean, wet washcloth, thoroughly rinse your entire body. Make sure that all body creases and crevices are rinsed well. 6. Dry off with a clean towel. Do not put any substances on your body afterward--such as powder, lotion, or perfume--unless you are told to do so by your health care provider. Only use lotions that are recommended by the manufacturer. 7. Put on clean clothes or pajamas. 8. If it is the night before your surgery, sleep in clean sheets. How to use CHG prepackaged cloths  Only use CHG cloths as told by your health care provider, and follow the instructions on the label.  Use the CHG cloth on clean, dry skin.  Do not use the CHG cloth on your head or face unless your health care provider tells you to.  When washing with the CHG cloth: ? Avoid your genital area. ? Avoid any areas of skin that have broken skin, cuts, or scrapes. Before surgery Follow these steps when using a CHG cloth to clean before surgery (unless your health care provider gives you different  instructions): 1. Using the CHG cloth, vigorously scrub the part of your body where you will be having surgery. Scrub using a back-and-forth motion for 3 minutes. The area on your body should be completely wet with CHG when you are done scrubbing. 2. Do not rinse. Discard the cloth and let the area air-dry. Do not put any substances on the area afterward, such as powder, lotion, or perfume. 3. Put on clean clothes or pajamas. 4. If it is the night before your surgery, sleep in clean sheets.  For general bathing Follow these steps when using CHG cloths for general bathing (unless your health care provider gives you different instructions). 1. Use a separate CHG cloth for each area of your body. Make sure you wash between any folds of skin and between your fingers and toes. Wash your body in the following order, switching to a new cloth after each step: ? The front of your neck, shoulders, and chest. ? Both of your arms, under your arms, and your hands. ? Your stomach and groin area, avoiding the genitals. ? Your right leg and foot. ? Your left leg and foot. ? The back of your neck, your back, and your buttocks. 2. Do not rinse. Discard the cloth and let the area air-dry. Do not put any substances on your body afterward--such as powder, lotion, or perfume--unless you are told to do so by your health care provider. Only use lotions that are recommended by the manufacturer. 3. Put on clean clothes or pajamas. Contact a health care provider if:  Your skin gets irritated after scrubbing.  You have questions about using your solution or cloth. Get help right away if:  Your eyes become very red or swollen.  Your eyes itch badly.  Your skin itches badly and is red or swollen.  Your hearing changes.  You have trouble seeing.  You have swelling or tingling in your mouth or throat.  You have trouble breathing.  You swallow any chlorhexidine. Summary  Chlorhexidine gluconate (CHG) is a  germ-killing (antiseptic) solution that is used to clean the skin. Cleaning your skin with CHG may help to lower your risk for infection.  You may be given CHG to use for bathing. It may be in a bottle or in a prepackaged cloth to use on your skin. Carefully follow your health care provider's instructions and the instructions on the product label.  Do not use CHG if you have a chlorhexidine allergy.  Contact your health care provider if your skin gets irritated after scrubbing. This information is not intended to replace advice given to you by your health care provider. Make sure you discuss any questions you have with your health care provider. Document Revised: 03/04/2019 Document Reviewed: 11/13/2017 Elsevier Patient Education  [place].

## 2020-08-25 ENCOUNTER — Encounter (HOSPITAL_COMMUNITY)
Admission: RE | Admit: 2020-08-25 | Discharge: 2020-08-25 | Disposition: A | Discharge status: Home / Self Care | Payer: BC Managed Care – PPO | Source: Ambulatory Visit | Attending: General Surgery | Admitting: General Surgery

## 2020-08-25 ENCOUNTER — Other Ambulatory Visit (HOSPITAL_COMMUNITY)
Admission: RE | Admit: 2020-08-25 | Discharge: 2020-08-25 | Disposition: A | Discharge status: Home / Self Care | Payer: BC Managed Care – PPO | Source: Ambulatory Visit | Attending: General Surgery | Admitting: General Surgery

## 2020-08-25 ENCOUNTER — Other Ambulatory Visit: Payer: Self-pay

## 2020-08-25 DIAGNOSIS — Z20822 Contact with and (suspected) exposure to covid-19: Secondary | ICD-10-CM | POA: Insufficient documentation

## 2020-08-25 DIAGNOSIS — Z01812 Encounter for preprocedural laboratory examination: Secondary | ICD-10-CM | POA: Insufficient documentation

## 2020-08-25 LAB — SARS CORONAVIRUS 2 (TAT 6-24 HRS): SARS Coronavirus 2: NEGATIVE

## 2020-08-25 LAB — PREGNANCY, URINE: Preg Test, Ur: NEGATIVE

## 2020-08-28 ENCOUNTER — Encounter (HOSPITAL_COMMUNITY)
Admission: RE | Disposition: A | Discharge status: Home / Self Care | Payer: Self-pay | Source: Home / Self Care | Attending: General Surgery

## 2020-08-28 ENCOUNTER — Encounter (HOSPITAL_COMMUNITY): Payer: Self-pay | Admitting: General Surgery

## 2020-08-28 ENCOUNTER — Other Ambulatory Visit: Payer: Self-pay

## 2020-08-28 ENCOUNTER — Ambulatory Visit (HOSPITAL_COMMUNITY)
Admission: RE | Admit: 2020-08-28 | Discharge: 2020-08-28 | Disposition: A | Discharge status: Home / Self Care | Payer: BC Managed Care – PPO | Attending: General Surgery | Admitting: General Surgery

## 2020-08-28 ENCOUNTER — Ambulatory Visit (HOSPITAL_COMMUNITY): Payer: BC Managed Care – PPO | Admitting: Anesthesiology

## 2020-08-28 DIAGNOSIS — K8020 Calculus of gallbladder without cholecystitis without obstruction: Secondary | ICD-10-CM | POA: Diagnosis not present

## 2020-08-28 DIAGNOSIS — Z87891 Personal history of nicotine dependence: Secondary | ICD-10-CM | POA: Diagnosis not present

## 2020-08-28 DIAGNOSIS — E282 Polycystic ovarian syndrome: Secondary | ICD-10-CM | POA: Diagnosis not present

## 2020-08-28 DIAGNOSIS — Z87442 Personal history of urinary calculi: Secondary | ICD-10-CM | POA: Insufficient documentation

## 2020-08-28 DIAGNOSIS — K8010 Calculus of gallbladder with chronic cholecystitis without obstruction: Secondary | ICD-10-CM | POA: Insufficient documentation

## 2020-08-28 DIAGNOSIS — R7303 Prediabetes: Secondary | ICD-10-CM | POA: Diagnosis not present

## 2020-08-28 HISTORY — PX: CHOLECYSTECTOMY: SHX55

## 2020-08-28 SURGERY — LAPAROSCOPIC CHOLECYSTECTOMY
Anesthesia: General | Site: Abdomen

## 2020-08-28 MED ORDER — HYDROMORPHONE HCL 1 MG/ML IJ SOLN
0.2500 mg | INTRAMUSCULAR | Status: DC | PRN
  Administered 2020-08-28: 0.5 mg via INTRAVENOUS
  Filled 2020-08-28: qty 0.5

## 2020-08-28 MED ORDER — SCOPOLAMINE 1 MG/3DAYS TD PT72: 1.0000 | MEDICATED_PATCH | Freq: Once | TRANSDERMAL | Status: DC

## 2020-08-28 MED ORDER — SUCCINYLCHOLINE CHLORIDE 200 MG/10ML IV SOSY
PREFILLED_SYRINGE | INTRAVENOUS | Status: AC
  Filled 2020-08-28: qty 20

## 2020-08-28 MED ORDER — ONDANSETRON HCL 4 MG PO TABS: 4.0000 mg | ORAL_TABLET | Freq: Three times a day (TID) | ORAL | 1 refills | Status: DC | PRN

## 2020-08-28 MED ORDER — SUCCINYLCHOLINE CHLORIDE 20 MG/ML IJ SOLN
INTRAMUSCULAR | Status: DC | PRN
  Administered 2020-08-28: 160 mg via INTRAVENOUS

## 2020-08-28 MED ORDER — SUCCINYLCHOLINE CHLORIDE 200 MG/10ML IV SOSY
PREFILLED_SYRINGE | INTRAVENOUS | Status: AC
  Filled 2020-08-28: qty 10

## 2020-08-28 MED ORDER — CIPROFLOXACIN IN D5W 400 MG/200ML IV SOLN
INTRAVENOUS | Status: AC
  Filled 2020-08-28: qty 200

## 2020-08-28 MED ORDER — ORAL CARE MOUTH RINSE: 15.0000 mL | Freq: Once | OROMUCOSAL | Status: AC

## 2020-08-28 MED ORDER — PROPOFOL 10 MG/ML IV BOLUS
INTRAVENOUS | Status: AC
  Filled 2020-08-28: qty 20

## 2020-08-28 MED ORDER — BUPIVACAINE LIPOSOME 1.3 % IJ SUSP
INTRAMUSCULAR | Status: AC
  Filled 2020-08-28: qty 10

## 2020-08-28 MED ORDER — LIDOCAINE HCL (CARDIAC) PF 50 MG/5ML IV SOSY
PREFILLED_SYRINGE | INTRAVENOUS | Status: DC | PRN
  Administered 2020-08-28: 100 mg via INTRAVENOUS

## 2020-08-28 MED ORDER — HEMOSTATIC AGENTS (NO CHARGE) OPTIME
TOPICAL | Status: DC | PRN
  Administered 2020-08-28: 1 via TOPICAL

## 2020-08-28 MED ORDER — MIDAZOLAM HCL 2 MG/2ML IJ SOLN
INTRAMUSCULAR | Status: DC | PRN
  Administered 2020-08-28: 2 mg via INTRAVENOUS

## 2020-08-28 MED ORDER — MEPERIDINE HCL 50 MG/ML IJ SOLN: 6.2500 mg | INTRAMUSCULAR | Status: DC | PRN

## 2020-08-28 MED ORDER — CHLORHEXIDINE GLUCONATE CLOTH 2 % EX PADS: 6.0000 | MEDICATED_PAD | Freq: Once | CUTANEOUS | Status: DC

## 2020-08-28 MED ORDER — ONDANSETRON HCL 4 MG/2ML IJ SOLN
INTRAMUSCULAR | Status: AC
  Filled 2020-08-28: qty 4

## 2020-08-28 MED ORDER — BUPIVACAINE LIPOSOME 1.3 % IJ SUSP
INTRAMUSCULAR | Status: DC | PRN
  Administered 2020-08-28: 20 mL

## 2020-08-28 MED ORDER — PROPOFOL 10 MG/ML IV BOLUS
INTRAVENOUS | Status: DC | PRN
  Administered 2020-08-28: 200 mg via INTRAVENOUS

## 2020-08-28 MED ORDER — ROCURONIUM BROMIDE 10 MG/ML (PF) SYRINGE
PREFILLED_SYRINGE | INTRAVENOUS | Status: AC
  Filled 2020-08-28: qty 20

## 2020-08-28 MED ORDER — CIPROFLOXACIN IN D5W 400 MG/200ML IV SOLN
400.0000 mg | INTRAVENOUS | Status: AC
  Administered 2020-08-28: 400 mg via INTRAVENOUS

## 2020-08-28 MED ORDER — FENTANYL CITRATE (PF) 100 MCG/2ML IJ SOLN
INTRAMUSCULAR | Status: DC | PRN
  Administered 2020-08-28: 50 ug via INTRAVENOUS
  Administered 2020-08-28: 150 ug via INTRAVENOUS
  Administered 2020-08-28: 50 ug via INTRAVENOUS

## 2020-08-28 MED ORDER — MIDAZOLAM HCL 2 MG/2ML IJ SOLN
INTRAMUSCULAR | Status: AC
  Filled 2020-08-28: qty 2

## 2020-08-28 MED ORDER — FENTANYL CITRATE (PF) 250 MCG/5ML IJ SOLN
INTRAMUSCULAR | Status: AC
  Filled 2020-08-28: qty 5

## 2020-08-28 MED ORDER — LIDOCAINE 2% (20 MG/ML) 5 ML SYRINGE
INTRAMUSCULAR | Status: AC
  Filled 2020-08-28: qty 5

## 2020-08-28 MED ORDER — LACTATED RINGERS IV SOLN: Freq: Once | INTRAVENOUS | Status: AC

## 2020-08-28 MED ORDER — SUGAMMADEX SODIUM 500 MG/5ML IV SOLN
INTRAVENOUS | Status: DC | PRN
  Administered 2020-08-28: 200 mg via INTRAVENOUS

## 2020-08-28 MED ORDER — HYDROCODONE-ACETAMINOPHEN 5-325 MG PO TABS
1.0000 | ORAL_TABLET | ORAL | 0 refills | Status: DC | PRN
Start: 2020-08-28 — End: 2022-02-05

## 2020-08-28 MED ORDER — PROMETHAZINE HCL 25 MG/ML IJ SOLN: 6.2500 mg | INTRAMUSCULAR | Status: DC | PRN

## 2020-08-28 MED ORDER — SODIUM CHLORIDE 0.9 % IR SOLN
Status: DC | PRN
  Administered 2020-08-28: 1000 mL

## 2020-08-28 MED ORDER — KETOROLAC TROMETHAMINE 30 MG/ML IJ SOLN
30.0000 mg | Freq: Once | INTRAMUSCULAR | Status: AC
  Administered 2020-08-28: 30 mg via INTRAVENOUS
  Filled 2020-08-28: qty 1

## 2020-08-28 MED ORDER — DEXAMETHASONE SODIUM PHOSPHATE 10 MG/ML IJ SOLN
INTRAMUSCULAR | Status: DC | PRN
  Administered 2020-08-28: 5 mg via INTRAVENOUS

## 2020-08-28 MED ORDER — ONDANSETRON HCL 4 MG/2ML IJ SOLN
INTRAMUSCULAR | Status: DC | PRN
  Administered 2020-08-28: 4 mg via INTRAVENOUS

## 2020-08-28 MED ORDER — CHLORHEXIDINE GLUCONATE 0.12 % MT SOLN
15.0000 mL | Freq: Once | OROMUCOSAL | Status: AC
  Administered 2020-08-28: 15 mL via OROMUCOSAL

## 2020-08-28 SURGICAL SUPPLY — 47 items
ADH SKN CLS APL DERMABOND .7 (GAUZE/BANDAGES/DRESSINGS) ×1
APL SRG 38 LTWT LNG FL B (MISCELLANEOUS)
APPLICATOR ARISTA FLEXITIP XL (MISCELLANEOUS) IMPLANT
APPLIER CLIP ROT 10 11.4 M/L (STAPLE) ×3
APR CLP MED LRG 11.4X10 (STAPLE) ×1
BAG RETRIEVAL 10 (BASKET) ×1
BAG RETRIEVAL 10MM (BASKET) ×1
CLIP APPLIE ROT 10 11.4 M/L (STAPLE) ×1 IMPLANT
CLOTH BEACON ORANGE TIMEOUT ST (SAFETY) ×3 IMPLANT
COVER LIGHT HANDLE STERIS (MISCELLANEOUS) ×6 IMPLANT
COVER WAND RF STERILE (DRAPES) ×3 IMPLANT
DERMABOND ADVANCED (GAUZE/BANDAGES/DRESSINGS) ×2
DERMABOND ADVANCED .7 DNX12 (GAUZE/BANDAGES/DRESSINGS) ×1 IMPLANT
DURAPREP 26ML APPLICATOR (WOUND CARE) ×3 IMPLANT
ELECT REM PT RETURN 9FT ADLT (ELECTROSURGICAL) ×3
ELECTRODE REM PT RTRN 9FT ADLT (ELECTROSURGICAL) ×1 IMPLANT
GLOVE BIOGEL PI IND STRL 7.0 (GLOVE) ×2 IMPLANT
GLOVE BIOGEL PI INDICATOR 7.0 (GLOVE) ×4
GLOVE SURG SS PI 7.5 STRL IVOR (GLOVE) ×3 IMPLANT
GOWN STRL REUS W/TWL LRG LVL3 (GOWN DISPOSABLE) ×9 IMPLANT
HEMOSTAT ARISTA ABSORB 3G PWDR (HEMOSTASIS) IMPLANT
HEMOSTAT SNOW SURGICEL 2X4 (HEMOSTASIS) ×3 IMPLANT
INST SET LAPROSCOPIC AP (KITS) ×3 IMPLANT
KIT TURNOVER KIT A (KITS) ×3 IMPLANT
MANIFOLD NEPTUNE II (INSTRUMENTS) ×3 IMPLANT
NDL HYPO 18GX1.5 BLUNT FILL (NEEDLE) ×1 IMPLANT
NDL INSUFFLATION 14GA 120MM (NEEDLE) ×1 IMPLANT
NEEDLE HYPO 18GX1.5 BLUNT FILL (NEEDLE) ×3 IMPLANT
NEEDLE HYPO 22GX1.5 SAFETY (NEEDLE) ×3 IMPLANT
NEEDLE INSUFFLATION 14GA 120MM (NEEDLE) ×3 IMPLANT
NS IRRIG 1000ML POUR BTL (IV SOLUTION) ×3 IMPLANT
PACK LAP CHOLE LZT030E (CUSTOM PROCEDURE TRAY) ×3 IMPLANT
PAD ARMBOARD 7.5X6 YLW CONV (MISCELLANEOUS) ×3 IMPLANT
SET BASIN LINEN APH (SET/KITS/TRAYS/PACK) ×3 IMPLANT
SET TUBE SMOKE EVAC HIGH FLOW (TUBING) ×3 IMPLANT
SLEEVE ENDOPATH XCEL 5M (ENDOMECHANICALS) ×3 IMPLANT
SUT MNCRL AB 4-0 PS2 18 (SUTURE) ×6 IMPLANT
SUT VICRYL 0 UR6 27IN ABS (SUTURE) ×3 IMPLANT
SYR 20ML LL LF (SYRINGE) ×6 IMPLANT
SYS BAG RETRIEVAL 10MM (BASKET) ×1
SYSTEM BAG RETRIEVAL 10MM (BASKET) ×1 IMPLANT
TROCAR ENDO BLADELESS 11MM (ENDOMECHANICALS) ×3 IMPLANT
TROCAR XCEL NON-BLD 5MMX100MML (ENDOMECHANICALS) ×3 IMPLANT
TROCAR XCEL UNIV SLVE 11M 100M (ENDOMECHANICALS) ×3 IMPLANT
TUBE CONNECTING 12'X1/4 (SUCTIONS) ×1
TUBE CONNECTING 12X1/4 (SUCTIONS) ×2 IMPLANT
WARMER LAPAROSCOPE (MISCELLANEOUS) ×3 IMPLANT

## 2020-08-28 NOTE — Anesthesia Procedure Notes (Signed)
Procedure Name: Intubation Performed by: Tacy Learn, CRNA Pre-anesthesia Checklist: Patient identified, Emergency Drugs available, Suction available, Patient being monitored and Timeout performed Patient Re-evaluated:Patient Re-evaluated prior to induction Oxygen Delivery Method: Circle system utilized Preoxygenation: Pre-oxygenation with 100% oxygen Induction Type: IV induction Laryngoscope Size: Miller and 2 Grade View: Grade II Tube size: 7.0 mm Number of attempts: 1 Airway Equipment and Method: Stylet Secured at: 21 cm Tube secured with: Tape Dental Injury: Teeth and Oropharynx as per pre-operative assessment

## 2020-08-28 NOTE — Discharge Instructions (Signed)
Laparoscopic Cholecystectomy, Care After This sheet gives you information about how to care for yourself after your procedure. Your health care provider may also give you more specific instructions. If you have problems or questions, contact your health care provider. What can I expect after the procedure? After the procedure, it is common to have:  Pain at your incision sites. You will be given medicines to control this pain.  Mild nausea or vomiting.  Bloating and possible shoulder pain from the air-like gas that was used during the procedure. Follow these instructions at home: Incision care   Follow instructions from your health care provider about how to take care of your incisions. Make sure you: ? Wash your hands with soap and water before you change your bandage (dressing). If soap and water are not available, use hand sanitizer. ? Change your dressing as told by your health care provider. ? Leave stitches (sutures), skin glue, or adhesive strips in place. These skin closures may need to be in place for 2 weeks or longer. If adhesive strip edges start to loosen and curl up, you may trim the loose edges. Do not remove adhesive strips completely unless your health care provider tells you to do that.  Do not take baths, swim, or use a hot tub until your health care provider approves. Ask your health care provider if you can take showers. You may only be allowed to take sponge baths for bathing.  Check your incision area every day for signs of infection. Check for: ? More redness, swelling, or pain. ? More fluid or blood. ? Warmth. ? Pus or a bad smell. Activity  Do not drive or use heavy machinery while taking prescription pain medicine.  Do not lift anything that is heavier than 10 lb (4.5 kg) until your health care provider approves.  Do not play contact sports until your health care provider approves.  Do not drive for 24 hours if you were given a medicine to help you relax  (sedative).  Rest as needed. Do not return to work or school until your health care provider approves. General instructions  Take over-the-counter and prescription medicines only as told by your health care provider.  To prevent or treat constipation while you are taking prescription pain medicine, your health care provider may recommend that you: ? Drink enough fluid to keep your urine clear or pale yellow. ? Take over-the-counter or prescription medicines. ? Eat foods that are high in fiber, such as fresh fruits and vegetables, whole grains, and beans. ? Limit foods that are high in fat and processed sugars, such as fried and sweet foods. Contact a health care provider if:  You develop a rash.  You have more redness, swelling, or pain around your incisions.  You have more fluid or blood coming from your incisions.  Your incisions feel warm to the touch.  You have pus or a bad smell coming from your incisions.  You have a fever.  One or more of your incisions breaks open. Get help right away if:  You have trouble breathing.  You have chest pain.  You have increasing pain in your shoulders.  You faint or feel dizzy when you stand.  You have severe pain in your abdomen.  You have nausea or vomiting that lasts for more than one day.  You have leg pain. This information is not intended to replace advice given to you by your health care provider. Make sure you discuss any questions you have   with your health care provider. Document Revised: 11/28/2017 Document Reviewed: 06/03/2016 Elsevier Patient Education  [place].  Bupivacaine Liposomal Suspension for Injection What is this medicine? BUPIVACAINE LIPOSOMAL (bue PIV a kane LIP oh som al) is an anesthetic. It causes loss of feeling in the skin or other tissues. It is used to prevent and to treat pain from some procedures. This medicine may be used for other purposes; ask your health care provider or pharmacist  if you have questions. COMMON BRAND NAME(S): EXPAREL What should I tell my health care provider before I take this medicine? They need to know if you have any of these conditions:  G6PD deficiency  heart disease  kidney disease  liver disease  low blood pressure  lung or breathing disease, like asthma  an unusual or allergic reaction to bupivacaine, other medicines, foods, dyes, or preservatives  pregnant or trying to get pregnant  breast-feeding How should I use this medicine? This medicine is for injection into the affected area. It is given by a health care professional in a hospital or clinic setting. Talk to your pediatrician regarding the use of this medicine in children. Special care may be needed. Overdosage: If you think you have taken too much of this medicine contact a poison control center or emergency room at once. NOTE: This medicine is only for you. Do not share this medicine with others. What if I miss a dose? This does not apply. What may interact with this medicine? This medicine may interact with the following medications:  acetaminophen  certain antibiotics like dapsone, nitrofurantoin, aminosalicylic acid, sulfonamides  certain medicines for seizures like phenobarbital, phenytoin, valproic acid  chloroquine  cyclophosphamide  flutamide  hydroxyurea  ifosfamide  metoclopramide  nitric oxide  nitroglycerin  nitroprusside  nitrous oxide  other local anesthetics like lidocaine, pramoxine, tetracaine  primaquine  quinine  rasburicase  sulfasalazine This list may not describe all possible interactions. Give your health care provider a list of all the medicines, herbs, non-prescription drugs, or dietary supplements you use. Also tell them if you smoke, drink alcohol, or use illegal drugs. Some items may interact with your medicine. What should I watch for while using this medicine? Your condition will be monitored carefully while you  are receiving this medicine. Be careful to avoid injury while the area is numb, and you are not aware of pain. What side effects may I notice from receiving this medicine? Side effects that you should report to your doctor or health care professional as soon as possible:  allergic reactions like skin rash, itching or hives, swelling of the face, lips, or tongue  seizures  signs and symptoms of a dangerous change in heartbeat or heart rhythm like chest pain; dizziness; fast, irregular heartbeat; palpitations; feeling faint or lightheaded; falls; breathing problems  signs and symptoms of methemoglobinemia such as pale, gray, or blue colored skin; headache; fast heartbeat; shortness of breath; feeling faint or lightheaded, falls; tiredness Side effects that usually do not require medical attention (report to your doctor or health care professional if they continue or are bothersome):  anxious  back pain  changes in taste  changes in vision  constipation  dizziness  fever  nausea, vomiting This list may not describe all possible side effects. Call your doctor for medical advice about side effects. You may report side effects to FDA at 1-800-FDA-1088. Where should I keep my medicine? This drug is given in a hospital or clinic and will not be stored at  home. NOTE: This sheet is a summary. It may not cover all possible information. If you have questions about this medicine, talk to your doctor, pharmacist, or health care provider.  2020 Elsevier/Gold Standard (2019-09-28 10:48:23)   General Anesthesia, Adult, Care After This sheet gives you information about how to care for yourself after your procedure. Your health care provider may also give you more specific instructions. If you have problems or questions, contact your health care provider. What can I expect after the procedure? After the procedure, the following side effects are common:  Pain or discomfort at the IV  site.  Nausea.  Vomiting.  Sore throat.  Trouble concentrating.  Feeling cold or chills.  Weak or tired.  Sleepiness and fatigue.  Soreness and body aches. These side effects can affect parts of the body that were not involved in surgery. Follow these instructions at home:  For at least 24 hours after the procedure:  Have a responsible adult stay with you. It is important to have someone help care for you until you are awake and alert.  Rest as needed.  Do not: ? Participate in activities in which you could fall or become injured. ? Drive. ? Use heavy machinery. ? Drink alcohol. ? Take sleeping pills or medicines that cause drowsiness. ? Make important decisions or sign legal documents. ? Take care of children on your own. Eating and drinking  Follow any instructions from your health care provider about eating or drinking restrictions.  When you feel hungry, start by eating small amounts of foods that are soft and easy to digest (bland), such as toast. Gradually return to your regular diet.  Drink enough fluid to keep your urine pale yellow.  If you vomit, rehydrate by drinking water, juice, or clear broth. General instructions  If you have sleep apnea, surgery and certain medicines can increase your risk for breathing problems. Follow instructions from your health care provider about wearing your sleep device: ? Anytime you are sleeping, including during daytime naps. ? While taking prescription pain medicines, sleeping medicines, or medicines that make you drowsy.  Return to your normal activities as told by your health care provider. Ask your health care provider what activities are safe for you.  Take over-the-counter and prescription medicines only as told by your health care provider.  If you smoke, do not smoke without supervision.  Keep all follow-up visits as told by your health care provider. This is important. Contact a health care provider if:  You  have nausea or vomiting that does not get better with medicine.  You cannot eat or drink without vomiting.  You have pain that does not get better with medicine.  You are unable to pass urine.  You develop a skin rash.  You have a fever.  You have redness around your IV site that gets worse. Get help right away if:  You have difficulty breathing.  You have chest pain.  You have blood in your urine or stool, or you vomit blood. Summary  After the procedure, it is common to have a sore throat or nausea. It is also common to feel tired.  Have a responsible adult stay with you for the first 24 hours after general anesthesia. It is important to have someone help care for you until you are awake and alert.  When you feel hungry, start by eating small amounts of foods that are soft and easy to digest (bland), such as toast. Gradually return to your regular  diet.  Drink enough fluid to keep your urine pale yellow.  Return to your normal activities as told by your health care provider. Ask your health care provider what activities are safe for you. This information is not intended to replace advice given to you by your health care provider. Make sure you discuss any questions you have with your health care provider. Document Revised: 12/19/2017 Document Reviewed: 08/01/2017 Elsevier Patient Education  [place].

## 2020-08-28 NOTE — Transfer of Care (Signed)
Immediate Anesthesia Transfer of Care Note  Patient: Linda Burnett  Procedure(s) Performed: LAPAROSCOPIC CHOLECYSTECTOMY (N/A Abdomen)  Patient Location: PACU  Anesthesia Type:General  Level of Consciousness: awake, alert , oriented and patient cooperative  Airway & Oxygen Therapy: Patient Spontanous Breathing  Post-op Assessment: Report given to RN, Post -op Vital signs reviewed and stable and Patient moving all extremities  Post vital signs: Reviewed and stable  Last Vitals:  Vitals Value Taken Time  BP    Temp    Pulse 85 08/28/20 1238  Resp 22 08/28/20 1238  SpO2 91 % 08/28/20 1238  Vitals shown include unvalidated device data.  Last Pain:  Vitals:   08/28/20 1149  TempSrc: Oral  PainSc: 0-No pain      Patients Stated Pain Goal: 6 (10/62/69 4854)  Complications: No complications documented.

## 2020-08-28 NOTE — Interval H&P Note (Signed)
History and Physical Interval Note:  08/28/2020 11:49 AM  Linda Burnett  has presented today for surgery, with the diagnosis of Cholelithiasis.  The various methods of treatment have been discussed with the patient and family. After consideration of risks, benefits and other options for treatment, the patient has consented to  Procedure(s): LAPAROSCOPIC CHOLECYSTECTOMY (N/A) as a surgical intervention.  The patient's history has been reviewed, patient examined, no change in status, stable for surgery.  I have reviewed the patient's chart and labs.  Questions were answered to the patient's satisfaction.     Aviva Signs

## 2020-08-28 NOTE — Op Note (Signed)
Patient:  Linda Burnett  DOB:  June 27, 1986  MRN:  [id number]   Preop Diagnosis: Cholelithiasis, biliary colic  Postop Diagnosis: Same  Procedure: Laparoscopic cholecystectomy  Surgeon: Aviva Signs, MD  Anes: General endotracheal  Indications: Patient is a 34 year old white female who presents with biliary colic secondary to cholelithiasis.  The risks and benefits of the procedure including bleeding, infection, hepatobiliary injury, and the possibility of an open procedure were fully explained to the patient, who gave informed consent.  Procedure note: The patient was placed in the supine position.  After induction of general endotracheal anesthesia, the abdomen was prepped and draped using the usual sterile technique with ChloraPrep.  Surgical site confirmation was performed.  A supraumbilical incision was made down to the fascia.  A Veress needle was introduced into the abdominal cavity and confirmation of placement was done using the saline drop test.  The abdomen was then insufflated to 15 mmHg pressure.  An 11 mm trocar was introduced into the abdominal cavity under direct visualization without difficulty.  The patient was placed in reverse Trendelenburg position and an additional 1 mm trocar was placed in the epigastric region and 5 mm trochars were placed the right upper quadrant and right flank regions.  The liver was inspected and noted to be within normal limits.  The gallbladder was retracted in a dynamic fashion in order to provide a critical view of the triangle of Calot.  The cystic duct was first identified.  Its juncture to the infundibulum was fully identified.  Endoclips were placed proximally and distally on the cystic duct, and the cystic duct was divided.  This was likewise done the cystic artery.  The gallbladder was freed away from the gallbladder fossa using Bovie electrocautery.  The gallbladder was delivered through the epigastric trocar site using an Endo Catch bag.   The gallbladder fossa was inspected and no abnormal bleeding or bile leakage was noted.  Surgicel snow was placed in the gallbladder fossa.  All fluid and air were then evacuated from the abdominal cavity prior to removal of the trochars.  All wounds were irrigated with normal saline.  All wounds were injected with Exparel.  All incisions were closed using a 4-0 Monocryl subcuticular suture.  Dermabond was applied.  All tape and needle counts were correct at the end of the procedure.  The patient was extubated in the operating room and transferred to PACU in stable condition.  Complications: None  EBL: Minimal  Specimen: Gallbladder

## 2020-08-28 NOTE — Anesthesia Preprocedure Evaluation (Signed)
Anesthesia Evaluation  Patient identified by MRN, date of birth, ID band Patient awake    Reviewed: Allergy & Precautions, NPO status , Patient's Chart, lab work & pertinent test results  History of Anesthesia Complications Negative for: history of anesthetic complications  Airway Mallampati: II  TM Distance: >3 FB Neck ROM: Full    Dental  (+) Dental Advisory Given, Teeth Intact   Pulmonary former smoker,    Pulmonary exam normal breath sounds clear to auscultation       Cardiovascular Exercise Tolerance: Good Normal cardiovascular exam Rhythm:Regular Rate:Normal     Neuro/Psych negative neurological ROS  negative psych ROS   GI/Hepatic GERD  Medicated and Controlled,  Endo/Other  Morbid obesity  Renal/GU Renal disease   POS    Musculoskeletal negative musculoskeletal ROS (+)   Abdominal   Peds  Hematology   Anesthesia Other Findings   Reproductive/Obstetrics negative OB ROS                             Anesthesia Physical Anesthesia Plan  ASA: II  Anesthesia Plan: General   Post-op Pain Management:    Induction: Intravenous  PONV Risk Score and Plan: 4 or greater and Ondansetron, Dexamethasone, Midazolam and Scopolamine patch - Pre-op  Airway Management Planned: Oral ETT  Additional Equipment:   Intra-op Plan:   Post-operative Plan: Extubation in OR  Informed Consent: I have reviewed the patients History and Physical, chart, labs and discussed the procedure including the risks, benefits and alternatives for the proposed anesthesia with the patient or authorized representative who has indicated his/her understanding and acceptance.     Dental advisory given  Plan Discussed with: CRNA and Surgeon  Anesthesia Plan Comments:         Anesthesia Quick Evaluation

## 2020-08-28 NOTE — Anesthesia Postprocedure Evaluation (Signed)
Anesthesia Post Note  Patient: Linda Burnett  Procedure(s) Performed: LAPAROSCOPIC CHOLECYSTECTOMY (N/A Abdomen)  Patient location during evaluation: PACU Anesthesia Type: General Level of consciousness: awake, oriented, awake and alert and patient cooperative Pain management: pain level controlled Vital Signs Assessment: post-procedure vital signs reviewed and stable Respiratory status: spontaneous breathing, respiratory function stable and nonlabored ventilation Cardiovascular status: blood pressure returned to baseline and stable Postop Assessment: no headache and no backache Anesthetic complications: no   No complications documented.   Last Vitals:  Vitals:   08/28/20 1149  BP: 107/71  Pulse: 69  Resp: 20  Temp: 36.8 C  SpO2: 99%    Last Pain:  Vitals:   08/28/20 1149  TempSrc: Oral  PainSc: 0-No pain                 Tacy Learn

## 2020-08-29 ENCOUNTER — Encounter (HOSPITAL_COMMUNITY): Payer: Self-pay | Admitting: General Surgery

## 2020-08-30 LAB — SURGICAL PATHOLOGY

## 2020-08-31 ENCOUNTER — Encounter (HOSPITAL_COMMUNITY): Payer: BC Managed Care – PPO

## 2020-09-05 ENCOUNTER — Other Ambulatory Visit (HOSPITAL_COMMUNITY): Payer: BC Managed Care – PPO

## 2020-09-14 DIAGNOSIS — Z23 Encounter for immunization: Secondary | ICD-10-CM | POA: Diagnosis not present

## 2020-12-08 DIAGNOSIS — Z20828 Contact with and (suspected) exposure to other viral communicable diseases: Secondary | ICD-10-CM | POA: Diagnosis not present

## 2021-02-12 DIAGNOSIS — Z1283 Encounter for screening for malignant neoplasm of skin: Secondary | ICD-10-CM | POA: Diagnosis not present

## 2021-02-12 DIAGNOSIS — D485 Neoplasm of uncertain behavior of skin: Secondary | ICD-10-CM | POA: Diagnosis not present

## 2021-02-12 DIAGNOSIS — D225 Melanocytic nevi of trunk: Secondary | ICD-10-CM | POA: Diagnosis not present

## 2021-03-07 ENCOUNTER — Encounter: Payer: Self-pay | Admitting: Internal Medicine

## 2021-04-10 ENCOUNTER — Telehealth: Payer: Self-pay | Admitting: Internal Medicine

## 2021-04-10 NOTE — Telephone Encounter (Signed)
Pt will need ov due to increased sedation with last tcs.

## 2021-04-10 NOTE — Telephone Encounter (Signed)
TRIAGE OR OV

## 2021-06-20 ENCOUNTER — Ambulatory Visit: Payer: BC Managed Care – PPO | Admitting: Gastroenterology

## 2021-09-19 ENCOUNTER — Encounter: Payer: Self-pay | Admitting: Internal Medicine

## 2021-10-23 ENCOUNTER — Ambulatory Visit: Payer: BC Managed Care – PPO | Admitting: Gastroenterology

## 2022-02-05 ENCOUNTER — Other Ambulatory Visit: Payer: Self-pay

## 2022-02-05 ENCOUNTER — Encounter: Payer: Self-pay | Admitting: *Deleted

## 2022-02-05 ENCOUNTER — Ambulatory Visit (INDEPENDENT_AMBULATORY_CARE_PROVIDER_SITE_OTHER): Payer: BC Managed Care – PPO | Admitting: Gastroenterology

## 2022-02-05 ENCOUNTER — Encounter: Payer: Self-pay | Admitting: Gastroenterology

## 2022-02-05 VITALS — BP 136/87 | HR 76 | Temp 97.7°F | Ht 65.0 in | Wt 253.2 lb

## 2022-02-05 DIAGNOSIS — Z86010 Personal history of colonic polyps: Secondary | ICD-10-CM

## 2022-02-05 DIAGNOSIS — Z800 Family history of malignant neoplasm of digestive organs: Secondary | ICD-10-CM | POA: Insufficient documentation

## 2022-02-05 MED ORDER — PEG 3350-KCL-NA BICARB-NACL 420 G PO SOLR: ORAL | 0 refills | Status: DC

## 2022-02-05 NOTE — Progress Notes (Signed)
Primary Care Physician:  Patient, No Pcp Per (Inactive) Primary Gastroenterologist:  Dr. Gala Romney  Chief Complaint  Patient presents with   Colonoscopy    Doing ok, due for tcs    HPI:   Linda Burnett is a 36 y.o. female presenting today to arrange high risk surveillance colonoscopy. She has a family history of colon cancer in father. Father diagnosed in late 44s. Paternal grandmother also with colon cancer. Colonoscopy in 2017 with adenomas.   No overt GI bleeding. No changes in bowel habits, constipation, diarrhea. No abdominal pain. No N/V. GERD managed by dietary choices. Does not require any OTC agents. No dysphagia.   Has a little boy turning 2 in March.     Past Medical History:  Diagnosis Date   Kidney stone    Polycystic ovary syndrome    Prediabetes    Tubular adenoma 2017    Past Surgical History:  Procedure Laterality Date   CESAREAN SECTION N/A 03/08/2020   Procedure: CESAREAN SECTION;  Surgeon: Brien Few, MD;  Location: [place] [facility];  Service: Obstetrics;  Laterality: N/A;   CHOLECYSTECTOMY N/A 08/28/2020   Procedure: LAPAROSCOPIC CHOLECYSTECTOMY;  Surgeon: Aviva Signs, MD;  Location: [facility];  Service: General;  Laterality: N/A;   COLONOSCOPY N/A 04/05/2016   Dr.Rourk- 2-74mm polyps at the hepatic flexure, one 44mm polyp in the descending colon, the examined portion of the ileum was normal, the examination was o/w normal on direct and retroflexion views. bx= tubular adenoma and hyperplastic polyp.   DENTAL SURGERY     TONSILLECTOMY     UTERINE SEPTUM RESECTION     "took a uterine septum out before IVF"    Current Outpatient Medications  Medication Sig Dispense Refill   acetaminophen (TYLENOL) 500 MG tablet Take 1 tablet (500 mg total) by mouth every 6 (six) hours as needed. 30 tablet 0   No current facility-administered medications for this visit.    Allergies as of 02/05/2022   (No Known Allergies)    Family History  Problem Relation Age of  Onset   Colon cancer Father        late 46s   Diabetes Maternal Grandmother    Diabetes Paternal Grandmother    Colon cancer Paternal Grandmother    Heart attack Maternal Uncle        Died age 12     Social History   Socioeconomic History   Marital status: Married    Spouse name: Not on file   Number of children: Not on file   Years of education: Not on file   Highest education level: Not on file  Occupational History   Occupation: [employer]  Tobacco Use   Smoking status: Former    Types: Cigarettes    Quit date: 08/21/2015    Years since quitting: 6.4   Smokeless tobacco: Never  Vaping Use   Vaping Use: Never used  Substance and Sexual Activity   Alcohol use: No    Alcohol/week: 0.0 standard drinks   Drug use: No   Sexual activity: Yes  Other Topics Concern   Not on file  Social History Narrative   Not on file   Social Determinants of Health   Financial Resource Strain: Not on file  Food Insecurity: Not on file  Transportation Needs: Not on file  Physical Activity: Not on file  Stress: Not on file  Social Connections: Not on file  Intimate Partner Violence: Not on file    Review  of Systems: Gen: Denies any fever, chills, fatigue, weight loss, lack of appetite.  CV: Denies chest pain, heart palpitations, peripheral edema, syncope.  Resp: Denies shortness of breath at rest or with exertion. Denies wheezing or cough.  GI: see HPI GU : Denies urinary burning, urinary frequency, urinary hesitancy MS: Denies joint pain, muscle weakness, cramps, or limitation of movement.  Derm: Denies rash, itching, dry skin Psych: Denies depression, anxiety, memory loss, and confusion Heme: Denies bruising, bleeding, and enlarged lymph nodes.  Physical Exam: BP 136/87    Pulse 76    Temp 97.7 F (36.5 C) (Temporal)    Ht 5\' 5"  (1.651 m)    Wt 253 lb 3.2 oz (114.9 kg)    LMP 01/09/2022 (Approximate)    BMI 42.13 kg/m  General:   Alert and oriented. Pleasant and  cooperative. Well-nourished and well-developed.  Head:  Normocephalic and atraumatic. Eyes:  Without icterus, sclera clear and conjunctiva pink.  Ears:  Normal auditory acuity. Mouth: mask in place Lungs:  Clear to auscultation bilaterally. No wheezes, rales, or rhonchi. No distress.  Heart:  S1, S2 present without murmurs appreciated.  Abdomen:  +BS, soft, non-tender and non-distended. No HSM noted. No guarding or rebound. No masses appreciated.  Rectal:  Deferred  Msk:  Symmetrical without gross deformities. Normal posture. Extremities:  Without edema. Neurologic:  Alert and  oriented x4;  grossly normal neurologically. Skin:  Intact without significant lesions or rashes. Psych:  Alert and cooperative. Normal mood and affect.  ASSESSMENT: Linda Burnett is a 36 y.o. female presenting today to arrange high risk surveillance colonoscopy with family history of colon cancer in father (late 77s) and personal history of adenomas in the past.   She has no concerning lower or upper GI signs/symptoms.     PLAN:  Proceed with colonoscopy by Dr. Abbey Chatters  in near future: the risks, benefits, and alternatives have been discussed with the patient in detail. The patient states understanding and desires to proceed.   Further recommendations to follow  Annitta Needs, PhD, ANP-BC [facility]

## 2022-02-05 NOTE — Patient Instructions (Signed)
We are arranging a colonoscopy in the near future!  Further recommendations to follow!  It was a pleasure to see you today. I want to create trusting relationships with patients to provide genuine, compassionate, and quality care. I value your feedback. If you receive a survey regarding your visit,  I greatly appreciate you taking time to fill this out.   Samanthia Howland W. Janica Eldred, PhD, ANP-BC [facility]   

## 2022-02-05 NOTE — H&P (View-Only) (Signed)
Primary Care Physician:  Patient, No Pcp Per (Inactive) Primary Gastroenterologist:  Dr. Gala Romney  Chief Complaint  Patient presents with   Colonoscopy    Doing ok, due for tcs    HPI:   Linda Burnett is a 36 y.o. female presenting today to arrange high risk surveillance colonoscopy. She has a family history of colon cancer in father. Father diagnosed in late 46s. Paternal grandmother also with colon cancer. Colonoscopy in 2017 with adenomas.   No overt GI bleeding. No changes in bowel habits, constipation, diarrhea. No abdominal pain. No N/V. GERD managed by dietary choices. Does not require any OTC agents. No dysphagia.   Has a little boy turning 2 in March.     Past Medical History:  Diagnosis Date   Kidney stone    Polycystic ovary syndrome    Prediabetes    Tubular adenoma 2017    Past Surgical History:  Procedure Laterality Date   CESAREAN SECTION N/A 03/08/2020   Procedure: CESAREAN SECTION;  Surgeon: Brien Few, MD;  Location: [place] [facility];  Service: Obstetrics;  Laterality: N/A;   CHOLECYSTECTOMY N/A 08/28/2020   Procedure: LAPAROSCOPIC CHOLECYSTECTOMY;  Surgeon: Aviva Signs, MD;  Location: [facility];  Service: General;  Laterality: N/A;   COLONOSCOPY N/A 04/05/2016   Dr.Rourk- 2-65mm polyps at the hepatic flexure, one 43mm polyp in the descending colon, the examined portion of the ileum was normal, the examination was o/w normal on direct and retroflexion views. bx= tubular adenoma and hyperplastic polyp.   DENTAL SURGERY     TONSILLECTOMY     UTERINE SEPTUM RESECTION     "took a uterine septum out before IVF"    Current Outpatient Medications  Medication Sig Dispense Refill   acetaminophen (TYLENOL) 500 MG tablet Take 1 tablet (500 mg total) by mouth every 6 (six) hours as needed. 30 tablet 0   No current facility-administered medications for this visit.    Allergies as of 02/05/2022   (No Known Allergies)    Family History  Problem Relation Age of  Onset   Colon cancer Father        late 64s   Diabetes Maternal Grandmother    Diabetes Paternal Grandmother    Colon cancer Paternal Grandmother    Heart attack Maternal Uncle        Died age 85     Social History   Socioeconomic History   Marital status: Married    Spouse name: Not on file   Number of children: Not on file   Years of education: Not on file   Highest education level: Not on file  Occupational History   Occupation: [employer]  Tobacco Use   Smoking status: Former    Types: Cigarettes    Quit date: 08/21/2015    Years since quitting: 6.4   Smokeless tobacco: Never  Vaping Use   Vaping Use: Never used  Substance and Sexual Activity   Alcohol use: No    Alcohol/week: 0.0 standard drinks   Drug use: No   Sexual activity: Yes  Other Topics Concern   Not on file  Social History Narrative   Not on file   Social Determinants of Health   Financial Resource Strain: Not on file  Food Insecurity: Not on file  Transportation Needs: Not on file  Physical Activity: Not on file  Stress: Not on file  Social Connections: Not on file  Intimate Partner Violence: Not on file    Review  of Systems: Gen: Denies any fever, chills, fatigue, weight loss, lack of appetite.  CV: Denies chest pain, heart palpitations, peripheral edema, syncope.  Resp: Denies shortness of breath at rest or with exertion. Denies wheezing or cough.  GI: see HPI GU : Denies urinary burning, urinary frequency, urinary hesitancy MS: Denies joint pain, muscle weakness, cramps, or limitation of movement.  Derm: Denies rash, itching, dry skin Psych: Denies depression, anxiety, memory loss, and confusion Heme: Denies bruising, bleeding, and enlarged lymph nodes.  Physical Exam: BP 136/87    Pulse 76    Temp 97.7 F (36.5 C) (Temporal)    Ht 5\' 5"  (1.651 m)    Wt 253 lb 3.2 oz (114.9 kg)    LMP 01/09/2022 (Approximate)    BMI 42.13 kg/m  General:   Alert and oriented. Pleasant and  cooperative. Well-nourished and well-developed.  Head:  Normocephalic and atraumatic. Eyes:  Without icterus, sclera clear and conjunctiva pink.  Ears:  Normal auditory acuity. Mouth: mask in place Lungs:  Clear to auscultation bilaterally. No wheezes, rales, or rhonchi. No distress.  Heart:  S1, S2 present without murmurs appreciated.  Abdomen:  +BS, soft, non-tender and non-distended. No HSM noted. No guarding or rebound. No masses appreciated.  Rectal:  Deferred  Msk:  Symmetrical without gross deformities. Normal posture. Extremities:  Without edema. Neurologic:  Alert and  oriented x4;  grossly normal neurologically. Skin:  Intact without significant lesions or rashes. Psych:  Alert and cooperative. Normal mood and affect.  ASSESSMENT: Linda Burnett is a 36 y.o. female presenting today to arrange high risk surveillance colonoscopy with family history of colon cancer in father (late 20s) and personal history of adenomas in the past.   She has no concerning lower or upper GI signs/symptoms.     PLAN:  Proceed with colonoscopy by Dr. Abbey Chatters  in near future: the risks, benefits, and alternatives have been discussed with the patient in detail. The patient states understanding and desires to proceed.   Further recommendations to follow  Annitta Needs, PhD, ANP-BC [facility]

## 2022-02-06 ENCOUNTER — Encounter: Payer: Self-pay | Admitting: *Deleted

## 2022-02-13 NOTE — Patient Instructions (Signed)
°   Your procedure is scheduled on: 02/18/2022  Report to [facility] at   10:30  AM.  Call this number if you have problems the morning of surgery: [phone]   Remember:              Follow Directions on the letter you received from Your Physician's office regarding the Bowel Prep              No Smoking the day of Procedure :   Take these medicines the morning of surgery with A SIP OF WATER: none   Do not wear jewelry, make-up or nail polish.    Do not bring valuables to the hospital.  Contacts, dentures or bridgework may not be worn into surgery.  .   Patients discharged the day of surgery will not be allowed to drive home.     Colonoscopy, Adult, Care After This sheet gives you information about how to care for yourself after your procedure. Your health care provider may also give you more specific instructions. If you have problems or questions, contact your health care provider. What can I expect after the procedure? After the procedure, it is common to have: A small amount of blood in your stool for 24 hours after the procedure. Some gas. Mild abdominal cramping or bloating.  Follow these instructions at home: General instructions  For the first 24 hours after the procedure: Do not drive or use machinery. Do not sign important documents. Do not drink alcohol. Do your regular daily activities at a slower pace than normal. Eat soft, easy-to-digest foods. Rest often. Take over-the-counter or prescription medicines only as told by your health care provider. It is up to you to get the results of your procedure. Ask your health care provider, or the department performing the procedure, when your results will be ready. Relieving cramping and bloating Try walking around when you have cramps or feel bloated. Apply heat to your abdomen as told by your health care provider. Use a heat source that your health care provider recommends, such as a moist heat pack or a  heating pad. Place a towel between your skin and the heat source. Leave the heat on for 20-30 minutes. Remove the heat if your skin turns bright red. This is especially important if you are unable to feel pain, heat, or cold. You may have a greater risk of getting burned. Eating and drinking Drink enough fluid to keep your urine clear or pale yellow. Resume your normal diet as instructed by your health care provider. Avoid heavy or fried foods that are hard to digest. Avoid drinking alcohol for as long as instructed by your health care provider. Contact a health care provider if: You have blood in your stool 2-3 days after the procedure. Get help right away if: You have more than a small spotting of blood in your stool. You pass large blood clots in your stool. Your abdomen is swollen. You have nausea or vomiting. You have a fever. You have increasing abdominal pain that is not relieved with medicine. This information is not intended to replace advice given to you by your health care provider. Make sure you discuss any questions you have with your health care provider. Document Released: 07/30/2004 Document Revised: 09/09/2016 Document Reviewed: 02/27/2016 Elsevier Interactive Patient Education  [employer].

## 2022-02-15 ENCOUNTER — Encounter (HOSPITAL_COMMUNITY): Payer: Self-pay

## 2022-02-15 ENCOUNTER — Encounter (HOSPITAL_COMMUNITY): Payer: BC Managed Care – PPO

## 2022-02-15 ENCOUNTER — Encounter (HOSPITAL_COMMUNITY)
Admission: RE | Admit: 2022-02-15 | Discharge: 2022-02-15 | Disposition: A | Discharge status: Home / Self Care | Payer: BC Managed Care – PPO | Source: Ambulatory Visit | Attending: Internal Medicine | Admitting: Internal Medicine

## 2022-02-15 VITALS — BP 136/87 | HR 76 | Temp 97.7°F | Resp 18 | Ht 65.0 in | Wt 253.2 lb

## 2022-02-15 DIAGNOSIS — Z6841 Body Mass Index (BMI) 40.0 and over, adult: Secondary | ICD-10-CM | POA: Diagnosis not present

## 2022-02-15 DIAGNOSIS — D123 Benign neoplasm of transverse colon: Secondary | ICD-10-CM | POA: Diagnosis not present

## 2022-02-15 DIAGNOSIS — Z01812 Encounter for preprocedural laboratory examination: Secondary | ICD-10-CM | POA: Insufficient documentation

## 2022-02-15 DIAGNOSIS — Z86010 Personal history of colonic polyps: Secondary | ICD-10-CM | POA: Diagnosis not present

## 2022-02-15 DIAGNOSIS — Z1211 Encounter for screening for malignant neoplasm of colon: Secondary | ICD-10-CM | POA: Diagnosis not present

## 2022-02-15 DIAGNOSIS — K219 Gastro-esophageal reflux disease without esophagitis: Secondary | ICD-10-CM | POA: Diagnosis not present

## 2022-02-15 DIAGNOSIS — Z01818 Encounter for other preprocedural examination: Secondary | ICD-10-CM

## 2022-02-15 DIAGNOSIS — Z800 Family history of malignant neoplasm of digestive organs: Secondary | ICD-10-CM | POA: Diagnosis not present

## 2022-02-15 DIAGNOSIS — Z87891 Personal history of nicotine dependence: Secondary | ICD-10-CM | POA: Diagnosis not present

## 2022-02-15 HISTORY — DX: Personal history of urinary calculi: Z87.442

## 2022-02-15 LAB — GLUCOSE, CAPILLARY: Glucose-Capillary: 101 mg/dL — ABNORMAL HIGH (ref 70–99)

## 2022-02-15 LAB — PREGNANCY, URINE: Preg Test, Ur: NEGATIVE

## 2022-02-18 ENCOUNTER — Encounter (HOSPITAL_COMMUNITY)
Admission: RE | Disposition: A | Discharge status: Home / Self Care | Payer: Self-pay | Source: Home / Self Care | Attending: Internal Medicine

## 2022-02-18 ENCOUNTER — Ambulatory Visit (HOSPITAL_COMMUNITY): Payer: BC Managed Care – PPO | Admitting: Anesthesiology

## 2022-02-18 ENCOUNTER — Ambulatory Visit (HOSPITAL_COMMUNITY)
Admission: RE | Admit: 2022-02-18 | Discharge: 2022-02-18 | Disposition: A | Discharge status: Home / Self Care | Payer: BC Managed Care – PPO | Attending: Internal Medicine | Admitting: Internal Medicine

## 2022-02-18 ENCOUNTER — Encounter (HOSPITAL_COMMUNITY): Payer: Self-pay

## 2022-02-18 ENCOUNTER — Other Ambulatory Visit: Payer: Self-pay

## 2022-02-18 DIAGNOSIS — D123 Benign neoplasm of transverse colon: Secondary | ICD-10-CM | POA: Diagnosis not present

## 2022-02-18 DIAGNOSIS — Z800 Family history of malignant neoplasm of digestive organs: Secondary | ICD-10-CM | POA: Diagnosis not present

## 2022-02-18 DIAGNOSIS — K219 Gastro-esophageal reflux disease without esophagitis: Secondary | ICD-10-CM | POA: Insufficient documentation

## 2022-02-18 DIAGNOSIS — Z87891 Personal history of nicotine dependence: Secondary | ICD-10-CM | POA: Insufficient documentation

## 2022-02-18 DIAGNOSIS — Z1211 Encounter for screening for malignant neoplasm of colon: Secondary | ICD-10-CM | POA: Diagnosis not present

## 2022-02-18 DIAGNOSIS — Z86010 Personal history of colonic polyps: Secondary | ICD-10-CM | POA: Insufficient documentation

## 2022-02-18 DIAGNOSIS — K635 Polyp of colon: Secondary | ICD-10-CM

## 2022-02-18 DIAGNOSIS — Z6841 Body Mass Index (BMI) 40.0 and over, adult: Secondary | ICD-10-CM | POA: Diagnosis not present

## 2022-02-18 HISTORY — PX: POLYPECTOMY: SHX5525

## 2022-02-18 HISTORY — PX: COLONOSCOPY WITH PROPOFOL: SHX5780

## 2022-02-18 HISTORY — PX: BIOPSY: SHX5522

## 2022-02-18 SURGERY — COLONOSCOPY WITH PROPOFOL
Anesthesia: General

## 2022-02-18 MED ORDER — PROPOFOL 500 MG/50ML IV EMUL
INTRAVENOUS | Status: DC | PRN
  Administered 2022-02-18: 150 ug/kg/min via INTRAVENOUS

## 2022-02-18 MED ORDER — PROPOFOL 10 MG/ML IV BOLUS
INTRAVENOUS | Status: DC | PRN
  Administered 2022-02-18: 100 mg via INTRAVENOUS

## 2022-02-18 MED ORDER — LIDOCAINE HCL (CARDIAC) PF 100 MG/5ML IV SOSY
PREFILLED_SYRINGE | INTRAVENOUS | Status: DC | PRN
  Administered 2022-02-18: 50 mg via INTRAVENOUS

## 2022-02-18 MED ORDER — LACTATED RINGERS IV SOLN: INTRAVENOUS | Status: DC

## 2022-02-18 NOTE — Op Note (Signed)
[facility] Patient Name: Linda Burnett Procedure Date: 02/18/2022 11:37 AM MRN: [id number] Date of Birth: [birth year] Attending MD: Elon Alas. Abbey Chatters DO CSN: [id number] Age: 36 Admit Type: Outpatient Procedure:                Colonoscopy Indications:              Screening in patient at increased risk: Colorectal                            cancer in father before age 58, Surveillance:                            Personal history of adenomatous polyps on last                            colonoscopy 5 years ago Providers:                Elon Alas. Abbey Chatters, DO, Lambert Mody, Aram Candela Referring MD:              Medicines:                See the Anesthesia note for documentation of the                            administered medications Complications:            No immediate complications. Estimated Blood Loss:     Estimated blood loss was minimal. Procedure:                Pre-Anesthesia Assessment:                           - The anesthesia plan was to use monitored                            anesthesia care (MAC).                           After obtaining informed consent, the colonoscope                            was passed under direct vision. Throughout the                            procedure, the patient's blood pressure, pulse, and                            oxygen saturations were monitored continuously. The                            [id number]-HQ190L ([id number]) scope was introduced through                            the anus and advanced to the the cecum, identified  by appendiceal orifice and ileocecal valve. The                            colonoscopy was performed without difficulty. The                            patient tolerated the procedure well. The quality                            of the bowel preparation was evaluated using the                            BBPS [facility] Bowel Preparation Scale) with scores                             of: Right Colon = 3, Transverse Colon = 3 and Left                            Colon = 3 (entire mucosa seen well with no residual                            staining, small fragments of stool or opaque                            liquid). The total BBPS score equals 9. Scope In: 11:47:48 AM Scope Out: 11:59:26 AM Scope Withdrawal Time: 0 hours 9 minutes 21 seconds  Total Procedure Duration: 0 hours 11 minutes 38 seconds  Findings:      The perianal and digital rectal examinations were normal.      A 2 mm polyp was found in the transverse colon. The polyp was sessile.       The polyp was removed with a cold biopsy forceps. Resection and       retrieval were complete.      A 5 mm polyp was found in the transverse colon. The polyp was sessile.       The polyp was removed with a cold snare. Resection and retrieval were       complete.      The exam was otherwise without abnormality. Impression:               - One 2 mm polyp in the transverse colon, removed                            with a cold biopsy forceps. Resected and retrieved.                           - One 5 mm polyp in the transverse colon, removed                            with a cold snare. Resected and retrieved.                           - The examination was otherwise normal. Moderate Sedation:      Per Anesthesia  Care Recommendation:           - Patient has a contact number available for                            emergencies. The signs and symptoms of potential                            delayed complications were discussed with the                            patient. Return to normal activities tomorrow.                            Written discharge instructions were provided to the                            patient.                           - Resume previous diet.                           - Continue present medications.                           - Await pathology results.                            - Repeat colonoscopy in 5 years for surveillance.                           - Return to GI clinic PRN. Procedure Code(s):        --- Professional ---                           [phone], Colonoscopy, flexible; with removal of                            tumor(s), polyp(s), or other lesion(s) by snare                            technique                           45380, 27, Colonoscopy, flexible; with biopsy,                            single or multiple Diagnosis Code(s):        --- Professional ---                           Z80.0, Family history of malignant neoplasm of                            digestive organs  Z86.010, Personal history of colonic polyps                           K63.5, Polyp of colon CPT copyright 2019 American Medical Association. All rights reserved. The codes documented in this report are preliminary and upon coder review may  be revised to meet current compliance requirements. Elon Alas. Abbey Chatters, DO [place] Abbey Chatters, DO 02/18/2022 12:02:31 PM This report has been signed electronically. Number of Addenda: 0

## 2022-02-18 NOTE — Anesthesia Postprocedure Evaluation (Signed)
Anesthesia Post Note  Patient: Linda Burnett  Procedure(s) Performed: COLONOSCOPY WITH PROPOFOL POLYPECTOMY BIOPSY  Patient location during evaluation: Phase II Anesthesia Type: General Level of consciousness: awake and alert and oriented Pain management: pain level controlled Vital Signs Assessment: post-procedure vital signs reviewed and stable Respiratory status: spontaneous breathing, nonlabored ventilation and respiratory function stable Cardiovascular status: blood pressure returned to baseline and stable Postop Assessment: no apparent nausea or vomiting Anesthetic complications: no   No notable events documented.   Last Vitals:  Vitals:   02/18/22 1102 02/18/22 1205  BP: 118/78 105/60  Pulse: 61 78  Resp: (!) 21 19  Temp: 36.7 C 36.7 C  SpO2: 99% 99%    Last Pain:  Vitals:   02/18/22 1205  TempSrc: Oral  PainSc: 0-No pain                 Machai Desmith C Dominika Losey

## 2022-02-18 NOTE — Discharge Instructions (Addendum)
°  Colonoscopy Discharge Instructions  Read the instructions outlined below and refer to this sheet in the next few weeks. These discharge instructions provide you with general information on caring for yourself after you leave the hospital. Your doctor may also give you specific instructions. While your treatment has been planned according to the most current medical practices available, unavoidable complications occasionally occur.   ACTIVITY You may resume your regular activity, but move at a slower pace for the next 24 hours.  Take frequent rest periods for the next 24 hours.  Walking will help get rid of the air and reduce the bloated feeling in your belly (abdomen).  No driving for 24 hours (because of the medicine (anesthesia) used during the test).   Do not sign any important legal documents or operate any machinery for 24 hours (because of the anesthesia used during the test).  NUTRITION Drink plenty of fluids.  You may resume your normal diet as instructed by your doctor.  Begin with a light meal and progress to your normal diet. Heavy or fried foods are harder to digest and may make you feel sick to your stomach (nauseated).  Avoid alcoholic beverages for 24 hours or as instructed.  MEDICATIONS You may resume your normal medications unless your doctor tells you otherwise.  WHAT YOU CAN EXPECT TODAY Some feelings of bloating in the abdomen.  Passage of more gas than usual.  Spotting of blood in your stool or on the toilet paper.  IF YOU HAD POLYPS REMOVED DURING THE COLONOSCOPY: No aspirin products for 7 days or as instructed.  No alcohol for 7 days or as instructed.  Eat a soft diet for the next 24 hours.  FINDING OUT THE RESULTS OF YOUR TEST Not all test results are available during your visit. If your test results are not back during the visit, make an appointment with your caregiver to find out the results. Do not assume everything is normal if you have not heard from your  caregiver or the medical facility. It is important for you to follow up on all of your test results.  SEEK IMMEDIATE MEDICAL ATTENTION IF: You have more than a spotting of blood in your stool.  Your belly is swollen (abdominal distention).  You are nauseated or vomiting.  You have a temperature over 101.  You have abdominal pain or discomfort that is severe or gets worse throughout the day.   Your colonoscopy revealed 2 polyp(s) which I removed successfully. Await pathology results, my office will contact you. I recommend repeating colonoscopy in 5 years for surveillance purposes and family history of colon cancer. Otherwise follow up with GI as needed.     I hope you have a great rest of your week!  Elon Alas. Abbey Chatters, D.O. Gastroenterology and Hepatology [facility]

## 2022-02-18 NOTE — Anesthesia Preprocedure Evaluation (Signed)
Anesthesia Evaluation  Patient identified by MRN, date of birth, ID band Patient awake    Reviewed: Allergy & Precautions, NPO status , Patient's Chart, lab work & pertinent test results  Airway Mallampati: II  TM Distance: >3 FB Neck ROM: Full    Dental  (+) Dental Advisory Given Root canal :   Pulmonary former smoker,    Pulmonary exam normal breath sounds clear to auscultation       Cardiovascular negative cardio ROS Normal cardiovascular exam Rhythm:Regular Rate:Normal     Neuro/Psych negative neurological ROS  negative psych ROS   GI/Hepatic Neg liver ROS, GERD  ,  Endo/Other  Morbid obesity  Renal/GU negative Renal ROS  negative genitourinary   Musculoskeletal negative musculoskeletal ROS (+)   Abdominal   Peds negative pediatric ROS (+)  Hematology negative hematology ROS (+)   Anesthesia Other Findings   Reproductive/Obstetrics negative OB ROS                             Anesthesia Physical Anesthesia Plan  ASA: 2  Anesthesia Plan: General   Post-op Pain Management: Minimal or no pain anticipated   Induction: Intravenous  PONV Risk Score and Plan: TIVA  Airway Management Planned: Nasal Cannula and Natural Airway  Additional Equipment:   Intra-op Plan:   Post-operative Plan:   Informed Consent: I have reviewed the patients History and Physical, chart, labs and discussed the procedure including the risks, benefits and alternatives for the proposed anesthesia with the patient or authorized representative who has indicated his/her understanding and acceptance.     Dental advisory given  Plan Discussed with: CRNA and Surgeon  Anesthesia Plan Comments:         Anesthesia Quick Evaluation

## 2022-02-18 NOTE — Interval H&P Note (Signed)
History and Physical Interval Note:  02/18/2022 11:18 AM  Linda Burnett  has presented today for surgery, with the diagnosis of FH colon cancer, hx polyps.  The various methods of treatment have been discussed with the patient and family. After consideration of risks, benefits and other options for treatment, the patient has consented to  Procedure(s) with comments: COLONOSCOPY WITH PROPOFOL (N/A) - 12:30pm as a surgical intervention.  The patient's history has been reviewed, patient examined, no change in status, stable for surgery.  I have reviewed the patient's chart and labs.  Questions were answered to the patient's satisfaction.     Eloise Harman

## 2022-02-18 NOTE — Transfer of Care (Signed)
Immediate Anesthesia Transfer of Care Note  Patient: Linda Burnett  Procedure(s) Performed: COLONOSCOPY WITH PROPOFOL POLYPECTOMY BIOPSY  Patient Location: Short Stay  Anesthesia Type:General  Level of Consciousness: awake, alert  and oriented  Airway & Oxygen Therapy: Patient Spontanous Breathing  Post-op Assessment: Report given to RN and Post -op Vital signs reviewed and stable  Post vital signs: Reviewed and stable  Last Vitals:  Vitals Value Taken Time  BP    Temp    Pulse    Resp    SpO2      Last Pain:  Vitals:   02/18/22 1145  TempSrc:   PainSc: 0-No pain         Complications: No notable events documented.

## 2022-02-19 LAB — SURGICAL PATHOLOGY

## 2022-02-20 ENCOUNTER — Encounter (HOSPITAL_COMMUNITY): Payer: Self-pay | Admitting: Internal Medicine

## 2022-03-06 ENCOUNTER — Other Ambulatory Visit: Payer: Self-pay

## 2022-03-06 ENCOUNTER — Emergency Department (HOSPITAL_COMMUNITY): Payer: BC Managed Care – PPO

## 2022-03-06 ENCOUNTER — Emergency Department (HOSPITAL_COMMUNITY)
Admission: EM | Admit: 2022-03-06 | Discharge: 2022-03-06 | Disposition: A | Discharge status: Home / Self Care | Payer: BC Managed Care – PPO | Attending: Emergency Medicine | Admitting: Emergency Medicine

## 2022-03-06 ENCOUNTER — Encounter (HOSPITAL_COMMUNITY): Payer: Self-pay

## 2022-03-06 DIAGNOSIS — W2211XA Striking against or struck by driver side automobile airbag, initial encounter: Secondary | ICD-10-CM | POA: Insufficient documentation

## 2022-03-06 DIAGNOSIS — Y92410 Unspecified street and highway as the place of occurrence of the external cause: Secondary | ICD-10-CM | POA: Diagnosis not present

## 2022-03-06 DIAGNOSIS — N9489 Other specified conditions associated with female genital organs and menstrual cycle: Secondary | ICD-10-CM | POA: Insufficient documentation

## 2022-03-06 DIAGNOSIS — S0181XA Laceration without foreign body of other part of head, initial encounter: Secondary | ICD-10-CM | POA: Insufficient documentation

## 2022-03-06 DIAGNOSIS — W2210XA Striking against or struck by unspecified automobile airbag, initial encounter: Secondary | ICD-10-CM

## 2022-03-06 DIAGNOSIS — S0990XA Unspecified injury of head, initial encounter: Secondary | ICD-10-CM | POA: Diagnosis not present

## 2022-03-06 DIAGNOSIS — M25532 Pain in left wrist: Secondary | ICD-10-CM | POA: Insufficient documentation

## 2022-03-06 DIAGNOSIS — I959 Hypotension, unspecified: Secondary | ICD-10-CM | POA: Diagnosis not present

## 2022-03-06 DIAGNOSIS — M542 Cervicalgia: Secondary | ICD-10-CM | POA: Diagnosis not present

## 2022-03-06 DIAGNOSIS — S199XXA Unspecified injury of neck, initial encounter: Secondary | ICD-10-CM | POA: Diagnosis not present

## 2022-03-06 DIAGNOSIS — R58 Hemorrhage, not elsewhere classified: Secondary | ICD-10-CM | POA: Diagnosis not present

## 2022-03-06 DIAGNOSIS — R9431 Abnormal electrocardiogram [ECG] [EKG]: Secondary | ICD-10-CM | POA: Diagnosis not present

## 2022-03-06 LAB — I-STAT BETA HCG BLOOD, ED (MC, WL, AP ONLY): I-stat hCG, quantitative: <5 m[IU]/mL (ref <5)

## 2022-03-06 MED ORDER — IBUPROFEN 800 MG PO TABS
800.0000 mg | ORAL_TABLET | Freq: Once | ORAL | Status: AC
Start: 2022-03-06 — End: 2022-03-06
  Administered 2022-03-06: 800 mg via ORAL
  Filled 2022-03-06: qty 1

## 2022-03-06 MED ORDER — LIDOCAINE-EPINEPHRINE (PF) 2 %-1:200000 IJ SOLN
10.0000 mL | Freq: Once | INTRAMUSCULAR | Status: AC
  Administered 2022-03-06: 10 mL
  Filled 2022-03-06: qty 20

## 2022-03-06 MED ORDER — BACITRACIN ZINC 500 UNIT/GM EX OINT
TOPICAL_OINTMENT | Freq: Once | CUTANEOUS | Status: AC
  Administered 2022-03-06: 1 via TOPICAL
  Filled 2022-03-06: qty 0.9

## 2022-03-06 MED ORDER — HYDROCORTISONE 1 % EX CREA
TOPICAL_CREAM | Freq: Once | CUTANEOUS | Status: AC
  Filled 2022-03-06 (×2): qty 28

## 2022-03-06 MED ORDER — ACETAMINOPHEN 500 MG PO TABS
1000.0000 mg | ORAL_TABLET | Freq: Once | ORAL | Status: AC
  Administered 2022-03-06: 1000 mg via ORAL
  Filled 2022-03-06: qty 2
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
## 2022-03-06 NOTE — ED Notes (Signed)
Patient transported to CT 
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
## 2022-03-06 NOTE — ED Provider Notes (Signed)
Sutures performed on Dr. Elyse Hsu patient. Tolerated well. ? ?Physical Exam  ?BP 115/90   Pulse (!) 101   Temp 98 ?F (36.7 ?C) (Oral)   Resp (!) 23   Ht '5\' 5"'$  (1.651 m)   Wt 99.8 kg   LMP 02/08/2022   SpO2 99%   BMI 36.61 kg/m?  ? ?Physical Exam ?Skin: ?   Comments: 2.5cm laceration on left hairline  ? ? ?Procedures  ?Marland Kitchen.Laceration Repair ? ?Date/Time: 03/06/2022 10:18 AM ?Performed by: Rhae Hammock, PA-C ?Authorized by: Rhae Hammock, PA-C  ? ?Consent:  ?  Consent obtained:  Verbal ?  Consent given by:  Patient ?  Risks, benefits, and alternatives were discussed: yes   ?  Risks discussed:  Infection, need for additional repair, pain, poor cosmetic result and poor wound healing ?  Alternatives discussed:  No treatment and delayed treatment ?Universal protocol:  ?  Procedure explained and questions answered to patient or proxy's satisfaction: yes   ?  Test results available: yes   ?  Imaging studies available: yes   ?  Immediately prior to procedure, a time out was called: yes   ?  Patient identity confirmed:  Verbally with patient ?Anesthesia:  ?  Anesthesia method:  Local infiltration ?  Local anesthetic:  Lidocaine 2% w/o epi ?Laceration details:  ?  Location:  Face ?  Face location:  Forehead ?  Length (cm):  2.5 ?Pre-procedure details:  ?  Preparation:  Patient was prepped and draped in usual sterile fashion ?Exploration:  ?  Limited defect created (wound extended): yes   ?  Hemostasis achieved with:  Direct pressure ?  Imaging outcome: foreign body not noted   ?  Wound exploration: entire depth of wound visualized   ?  Contaminated: no   ?Treatment:  ?  Area cleansed with:  Saline ?  Amount of cleaning:  Standard ?  Irrigation solution:  Sterile saline ?  Irrigation volume:  74cc ?  Irrigation method:  Pressure wash and syringe ?  Debridement:  None ?  Undermining:  None ?Skin repair:  ?  Repair method:  Sutures ?  Suture size:  6-0 ?  Suture material:  Prolene ?  Number of sutures:   4 ?Approximation:  ?  Approximation:  Close ?Repair type:  ?  Repair type:  Simple ?Post-procedure details:  ?  Dressing:  Antibiotic ointment ?  Procedure completion:  Tolerated ? ?ED Course / MDM  ?  ?Medical Decision Making ?Amount and/or Complexity of Data Reviewed ?Radiology: ordered. ? ?Risk ?OTC drugs. ?Prescription drug management. ? ? ? ? ? ?  ?Aerika Groll A, PA-C ?03/06/22 1021 ? ?  ?Isla Pence, MD ?03/06/22 1125 ? ?

## 2022-04-11 DIAGNOSIS — D225 Melanocytic nevi of trunk: Secondary | ICD-10-CM | POA: Diagnosis not present

## 2022-04-11 DIAGNOSIS — D485 Neoplasm of uncertain behavior of skin: Secondary | ICD-10-CM | POA: Diagnosis not present

## 2022-04-11 DIAGNOSIS — Z1283 Encounter for screening for malignant neoplasm of skin: Secondary | ICD-10-CM | POA: Diagnosis not present

## 2022-06-12 ENCOUNTER — Emergency Department (HOSPITAL_COMMUNITY): Payer: BC Managed Care – PPO

## 2022-06-12 ENCOUNTER — Emergency Department (HOSPITAL_COMMUNITY)
Admission: EM | Admit: 2022-06-12 | Discharge: 2022-06-12 | Disposition: A | Discharge status: Home / Self Care | Payer: BC Managed Care – PPO | Attending: Emergency Medicine | Admitting: Emergency Medicine

## 2022-06-12 ENCOUNTER — Encounter (HOSPITAL_COMMUNITY): Payer: Self-pay | Admitting: *Deleted

## 2022-06-12 ENCOUNTER — Other Ambulatory Visit: Payer: Self-pay

## 2022-06-12 DIAGNOSIS — R0602 Shortness of breath: Secondary | ICD-10-CM | POA: Diagnosis not present

## 2022-06-12 DIAGNOSIS — R079 Chest pain, unspecified: Secondary | ICD-10-CM | POA: Diagnosis not present

## 2022-06-12 DIAGNOSIS — R0789 Other chest pain: Secondary | ICD-10-CM | POA: Insufficient documentation

## 2022-06-12 DIAGNOSIS — R002 Palpitations: Secondary | ICD-10-CM | POA: Insufficient documentation

## 2022-06-12 LAB — CBC
HCT: 40.2 % (ref 36.0–46.0)
Hemoglobin: 12.4 g/dL (ref 12.0–15.0)
MCH: 24.8 pg — ABNORMAL LOW (ref 26.0–34.0)
MCHC: 30.8 g/dL (ref 30.0–36.0)
MCV: 80.4 fL (ref 80.0–100.0)
Platelets: 361 10*3/uL (ref 150–400)
RBC: 5 MIL/uL (ref 3.87–5.11)
RDW: 15.9 % — ABNORMAL HIGH (ref 11.5–15.5)
WBC: 8.8 10*3/uL (ref 4.0–10.5)
nRBC: 0 % (ref 0.0–0.2)

## 2022-06-12 LAB — POC URINE PREG, ED: Preg Test, Ur: NEGATIVE

## 2022-06-12 LAB — BASIC METABOLIC PANEL
Anion gap: 8 (ref 5–15)
BUN: 11 mg/dL (ref 6–20)
CO2: 25 mmol/L (ref 22–32)
Calcium: 8.9 mg/dL (ref 8.9–10.3)
Chloride: 105 mmol/L (ref 98–111)
Creatinine, Ser: 0.74 mg/dL (ref 0.44–1.00)
GFR, Estimated: >60 mL/min (ref >60)
Glucose, Bld: 97 mg/dL (ref 70–99)
Potassium: 3.7 mmol/L (ref 3.5–5.1)
Sodium: 138 mmol/L (ref 135–145)

## 2022-06-12 LAB — MAGNESIUM: Magnesium: 2.1 mg/dL (ref 1.7–2.4)

## 2022-06-12 LAB — TROPONIN I (HIGH SENSITIVITY): Troponin I (High Sensitivity): <2 ng/L (ref <18)

## 2022-06-12 MED ORDER — LACTATED RINGERS IV BOLUS
1000.0000 mL | Freq: Once | INTRAVENOUS | Status: AC
  Administered 2022-06-12: 1000 mL via INTRAVENOUS

## 2022-06-12 NOTE — ED Provider Notes (Signed)
[facility] EMERGENCY DEPARTMENT Provider Note   CSN: [id number] Arrival date & time: 06/12/22  1736     History  Chief Complaint  Patient presents with   Palpitations    Linda Burnett is a 36 y.o. female.  HPI 36 year old female presents with palpitations.  She has had palpitations for quite some time and has been getting them about 3-4 times a week for a while.  However over the last 3 days they have been much more frequent and stronger.  She will get palpitations and feeling as hard to catch her breath for about 10 seconds.  Comes and goes frequently.  Has not occurred while in the emergency department.  Sometimes he gets some chest tightness.  No syncope/near syncope.  No recent weight loss or weight gain or night sweats.  She denies any significant caffeine use.  She saw cardiologist multiple years ago had a monitor but no significant findings.  Home Medications Prior to Admission medications   Medication Sig Start Date End Date Taking? Authorizing Provider  acetaminophen (TYLENOL) 500 MG tablet Take 1,000 mg by mouth every 6 (six) hours as needed for mild pain or headache.    [provider]      Allergies    Patient has no known allergies.    Review of Systems   Review of Systems  Constitutional:  Negative for diaphoresis.  Respiratory:  Positive for chest tightness and shortness of breath.   Cardiovascular:  Positive for palpitations. Negative for chest pain.  Gastrointestinal:  Negative for vomiting.  Neurological:  Negative for syncope and light-headedness.    Physical Exam Updated Vital Signs BP 129/70   Pulse 71   Temp 98 F (36.7 C)   Resp 20   Ht '5\' 5"'$  (1.651 m)   Wt 104.3 kg   LMP 05/21/2022   SpO2 100%   BMI 38.27 kg/m  Physical Exam Vitals and nursing note reviewed.  Constitutional:      Appearance: She is well-developed. She is obese.  HENT:     Head: Normocephalic and atraumatic.  Cardiovascular:     Rate and Rhythm: Normal rate  and regular rhythm.     Heart sounds: Normal heart sounds. No murmur heard. Pulmonary:     Effort: Pulmonary effort is normal.     Breath sounds: Normal breath sounds.  Abdominal:     Palpations: Abdomen is soft.     Tenderness: There is no abdominal tenderness.  Skin:    General: Skin is warm and dry.  Neurological:     Mental Status: She is alert.     ED Results / Procedures / Treatments   Labs (all labs ordered are listed, but only abnormal results are displayed) Labs Reviewed  CBC - Abnormal; Notable for the following components:      Result Value   MCH 24.8 (*)    RDW 15.9 (*)    All other components within normal limits  BASIC METABOLIC PANEL  MAGNESIUM  POC URINE PREG, ED  TROPONIN I (HIGH SENSITIVITY)    EKG EKG Interpretation  Date/Time:  Wednesday June 12 2022 18:05:42 EDT Ventricular Rate:  73 PR Interval:  162 QRS Duration: 92 QT Interval:  406 QTC Calculation: 447 R Axis:   0 Text Interpretation: Normal sinus rhythm Cannot rule out Anterior infarct , age undetermined T wave changes similar to mar 2023 Confirmed by Sherwood Gambler [phone]) on 06/12/2022 6:21:42 PM  Radiology DG Chest 2 View  Result Date: 06/12/2022 CLINICAL DATA:  Chest pain, palpitations EXAM: CHEST - 2 VIEW COMPARISON:  11/12/2017 FINDINGS: The heart size and mediastinal contours are within normal limits. Both lungs are clear. The visualized skeletal structures are unremarkable. IMPRESSION: No active cardiopulmonary disease. Electronically Signed   By: Elmer Picker M.D.   On: 06/12/2022 18:36    Procedures Procedures    Medications Ordered in ED Medications  lactated ringers bolus 1,000 mL (1,000 mLs Intravenous New Bag/Given 06/12/22 2001)    ED Course/ Medical Decision Making/ A&P                           Medical Decision Making Amount and/or Complexity of Data Reviewed External Data Reviewed: notes. Labs: ordered. Radiology: ordered and independent interpretation  performed. ECG/medicine tests: ordered and independent interpretation performed.   Patient presents with symptomatic palpitations.  She had 1 episode while in the emergency department but this was when she was being hooked back up to the monitor and so it does not appear to be captured when I looked at cardiac monitoring.  Probably some PVCs but hard to 100% say.  Her vital signs are normal and her blood pressure is normal so I do not know that putting her on a beta-blocker would be warranted at this moment.  However think is reasonable to refer to cardiology.  Her labs have been reviewed/interpreted and show no acute cause including no anemia or electrolyte disturbance.  Troponin was sent but is normal and with the length of times of her symptoms and no chest pain I do not think this is ACS and second troponin not needed.  Chest x-ray images viewed by myself and no cardiomegaly or pneumonia.  Discharged home with return precautions.        Final Clinical Impression(s) / ED Diagnoses Final diagnoses:  Heart palpitations    Rx / DC Orders ED Discharge Orders          Ordered    Ambulatory referral to Cardiology       Comments: If you have not heard from the Cardiology office within the next 72 hours please call [phone].   06/12/22 8546              Sherwood Gambler, MD 06/12/22 2105

## 2022-06-12 NOTE — Discharge Instructions (Addendum)
You are being given a referral to cardiology.  If you develop new or worsening palpitations or if you develop dizziness or lightheadedness, feeling like you are going to pass out, chest pain, shortness of breath, or any other new/concerning symptoms then return to the ER for evaluation.

## 2022-06-12 NOTE — ED Triage Notes (Signed)
Pt with palpitations x 3 days but today has been worse.  Hx of palpitations.  Catches breath at times.

## 2022-06-20 ENCOUNTER — Ambulatory Visit (INDEPENDENT_AMBULATORY_CARE_PROVIDER_SITE_OTHER): Payer: BC Managed Care – PPO | Admitting: Internal Medicine

## 2022-06-20 ENCOUNTER — Ambulatory Visit (INDEPENDENT_AMBULATORY_CARE_PROVIDER_SITE_OTHER): Payer: BC Managed Care – PPO

## 2022-06-20 ENCOUNTER — Encounter: Payer: Self-pay | Admitting: Internal Medicine

## 2022-06-20 VITALS — BP 116/80 | HR 79 | Ht 65.0 in | Wt 251.2 lb

## 2022-06-20 DIAGNOSIS — R002 Palpitations: Secondary | ICD-10-CM | POA: Diagnosis not present

## 2022-06-20 LAB — TSH: TSH: 0.883 u[IU]/mL (ref 0.450–4.500)

## 2022-06-20 NOTE — Progress Notes (Signed)
Cardiology Office Note:    Date:  06/20/2022   ID:  Linda Burnett, DOB 1986/11/29, MRN [id number]  PCP:  Patient, No Pcp Per   [facility] Providers Cardiologist:  None     Referring MD: Sherwood Gambler, MD   No chief complaint on file. Palpitations  History of Present Illness:    Linda Burnett is a 36 y.o. female with a hx of preDM2, obesity, referral for palpitations. She went to the ED. She had NSR with poor R wave progression.  She has had several weeks of palpitations. TSH was not checked. She continues to have symptoms.  She denies  consistent LH, dizziness, syncope, caffeine intake, etoh, family hx of SCD. She has no known structural heart dx. Uncle had an MI in his 22s. Her father had lung cancer, coronary dx, and CHF  She had a 14 day event monitor that showed sinus rhythm.   Past Medical History:  Diagnosis Date   History of kidney stones    Polycystic ovary syndrome    Prediabetes    Tubular adenoma 2017    Past Surgical History:  Procedure Laterality Date   BIOPSY  02/18/2022   Procedure: BIOPSY;  Surgeon: Eloise Harman, DO;  [facility];  Service: Endoscopy;;   CESAREAN SECTION N/A 03/08/2020   Procedure: CESAREAN SECTION;  Surgeon: Brien Few, MD;  Location: [place] [facility] ORS;  Service: Obstetrics;  Laterality: N/A;   CHOLECYSTECTOMY N/A 08/28/2020   Procedure: LAPAROSCOPIC CHOLECYSTECTOMY;  Surgeon: Aviva Signs, MD;  Location: [facility];  Service: General;  Laterality: N/A;   COLONOSCOPY N/A 04/05/2016   Dr.Rourk- 2-4m polyps at the hepatic flexure, one 422mpolyp in the descending colon, the examined portion of the ileum was normal, the examination was o/w normal on direct and retroflexion views. bx= tubular adenoma and hyperplastic polyp.   COLONOSCOPY WITH PROPOFOL N/A 02/18/2022   Procedure: COLONOSCOPY WITH PROPOFOL;  Surgeon: CaEloise HarmanDO;  [facility];  Service: Endoscopy;  Laterality: N/A;  12:30pm   DENTAL SURGERY      POLYPECTOMY  02/18/2022   Procedure: POLYPECTOMY;  Surgeon: CaEloise HarmanDO;  [facility];  Service: Endoscopy;;   TONSILLECTOMY     UTERINE SEPTUM RESECTION     "took a uterine septum out before IVF"    Current Medications: Current Meds  Medication Sig   acetaminophen (TYLENOL) 500 MG tablet Take 1,000 mg by mouth every 6 (six) hours as needed for mild pain or headache.     Allergies:   Patient has no known allergies.   Social History   Socioeconomic History   Marital status: Married    Spouse name: Not on file   Number of children: Not on file   Years of education: Not on file   Highest education level: Not on file  Occupational History   Occupation: [employer]  Tobacco Use   Smoking status: Former    Types: Cigarettes    Quit date: 08/21/2015    Years since quitting: 6.8   Smokeless tobacco: Never  Vaping Use   Vaping Use: Never used  Substance and Sexual Activity   Alcohol use: No    Alcohol/week: 0.0 standard drinks of alcohol   Drug use: No   Sexual activity: Yes  Other Topics Concern   Not on file  Social History Narrative   Not on file   Social Determinants of Health   Financial Resource Strain: Not on file  Food Insecurity:  Not on file  Transportation Needs: Not on file  Physical Activity: Not on file  Stress: Not on file  Social Connections: Not on file     Family History: The patient's family history includes Colon cancer in her father and paternal grandmother; Diabetes in her maternal grandmother and paternal grandmother; Heart attack in her maternal uncle.  ROS:   Please see the history of present illness.     All other systems reviewed and are negative.  EKGs/Labs/Other Studies Reviewed:    The following studies were reviewed today:   EKG:  EKG is  ordered today.  The ekg ordered today demonstrates no new today.   Recent Labs: 06/12/2022: BUN 11; Creatinine, Ser 0.74; Hemoglobin 12.4; Magnesium 2.1; Platelets 361;  Potassium 3.7; Sodium 138   Recent Lipid Panel No results found for: "CHOL", "TRIG", "HDL", "CHOLHDL", "VLDL", "LDLCALC", "LDLDIRECT"   Risk Assessment/Calculations:           Physical Exam:    Vitals:   06/20/22 1037  BP: 116/80  Pulse: 79  SpO2: 97%     VS:  BP 116/80   Pulse 79   Ht '5\' 5"'$  (1.651 m)   Wt 251 lb 3.2 oz (113.9 kg)   LMP 05/21/2022   SpO2 97%   BMI 41.80 kg/m     Wt Readings from Last 3 Encounters:  06/20/22 251 lb 3.2 oz (113.9 kg)  06/12/22 230 lb (104.3 kg)  03/06/22 220 lb (99.8 kg)     GEN:  Well nourished, well developed in no acute distress HEENT: Normal NECK: No JVD; No carotid bruits LYMPHATICS: No lymphadenopath CARDIAC: RRR, no murmurs, rubs, gallops RESPIRATORY:  Clear to auscultation without rales, wheezing or rhonchi  ABDOMEN: Soft, non-tender, non-distended MUSCULOSKELETAL:  No edema; No deformity  SKIN: Warm and dry NEUROLOGIC:  Alert and oriented x 3 PSYCHIATRIC:  Normal affect   ASSESSMENT:    Palpitations: She does not have high risk features including syncope c/f arrhythmia , family hx of SCD, or abnormalities on her EKG. Poor R wave progression likely 2/2 habitus. Will obtain TSH. Will get 3 day zio to assess for arrhythmia. Recommended that if her zio did not show any concerning rhythm issues to be screened for anxiety with a PCP.   PLAN:    In order of problems listed above:  TSH 3 Day Zio patch Follow up as needed (depending on the results)         Medication Adjustments/Labs and Tests Ordered: Current medicines are reviewed at length with the patient today.  Concerns regarding medicines are outlined above.  Orders Placed This Encounter  Procedures   LONG TERM MONITOR (3-14 DAYS)   No orders of the defined types were placed in this encounter.   There are no Patient Instructions on file for this visit.   Signed, Janina Mayo, MD  06/20/2022 10:48 AM    [facility]

## 2022-06-20 NOTE — Progress Notes (Unsigned)
Enrolled for [employer] to mail a ZIO XT long term holter monitor to the patients address on file.  

## 2022-06-23 DIAGNOSIS — R002 Palpitations: Secondary | ICD-10-CM | POA: Diagnosis not present
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
## 2022-07-08 DIAGNOSIS — R002 Palpitations: Secondary | ICD-10-CM | POA: Diagnosis not present

## 2022-07-31 ENCOUNTER — Encounter: Payer: Self-pay | Admitting: Internal Medicine

## 2022-09-03 DIAGNOSIS — Z0000 Encounter for general adult medical examination without abnormal findings: Secondary | ICD-10-CM | POA: Diagnosis not present

## 2022-09-05 DIAGNOSIS — Z0000 Encounter for general adult medical examination without abnormal findings: Secondary | ICD-10-CM | POA: Diagnosis not present

## 2022-09-19 DIAGNOSIS — R0989 Other specified symptoms and signs involving the circulatory and respiratory systems: Secondary | ICD-10-CM | POA: Diagnosis not present

## 2022-09-19 DIAGNOSIS — R0981 Nasal congestion: Secondary | ICD-10-CM | POA: Diagnosis not present

## 2022-09-19 DIAGNOSIS — R519 Headache, unspecified: Secondary | ICD-10-CM | POA: Diagnosis not present

## 2022-09-19 DIAGNOSIS — J069 Acute upper respiratory infection, unspecified: Secondary | ICD-10-CM | POA: Diagnosis not present

## 2022-10-02 DIAGNOSIS — B372 Candidiasis of skin and nail: Secondary | ICD-10-CM | POA: Diagnosis not present

## 2022-10-02 DIAGNOSIS — E538 Deficiency of other specified B group vitamins: Secondary | ICD-10-CM | POA: Diagnosis not present

## 2022-10-02 DIAGNOSIS — E611 Iron deficiency: Secondary | ICD-10-CM | POA: Diagnosis not present

## 2022-10-02 DIAGNOSIS — R790 Abnormal level of blood mineral: Secondary | ICD-10-CM | POA: Diagnosis not present

## 2022-10-02 DIAGNOSIS — E559 Vitamin D deficiency, unspecified: Secondary | ICD-10-CM | POA: Diagnosis not present

## 2022-10-02 DIAGNOSIS — Z800 Family history of malignant neoplasm of digestive organs: Secondary | ICD-10-CM | POA: Diagnosis not present

## 2022-10-02 DIAGNOSIS — R7309 Other abnormal glucose: Secondary | ICD-10-CM | POA: Diagnosis not present

## 2022-10-02 DIAGNOSIS — E282 Polycystic ovarian syndrome: Secondary | ICD-10-CM | POA: Diagnosis not present

## 2022-10-05 IMAGING — DX DG CHEST 2V
2 series · 2 of 2 positions shown · non-contrast
Comparison: 11/12/2017

CLINICAL DATA: Chest pain, palpitations

EXAM:
CHEST - 2 VIEW

[chest pa]
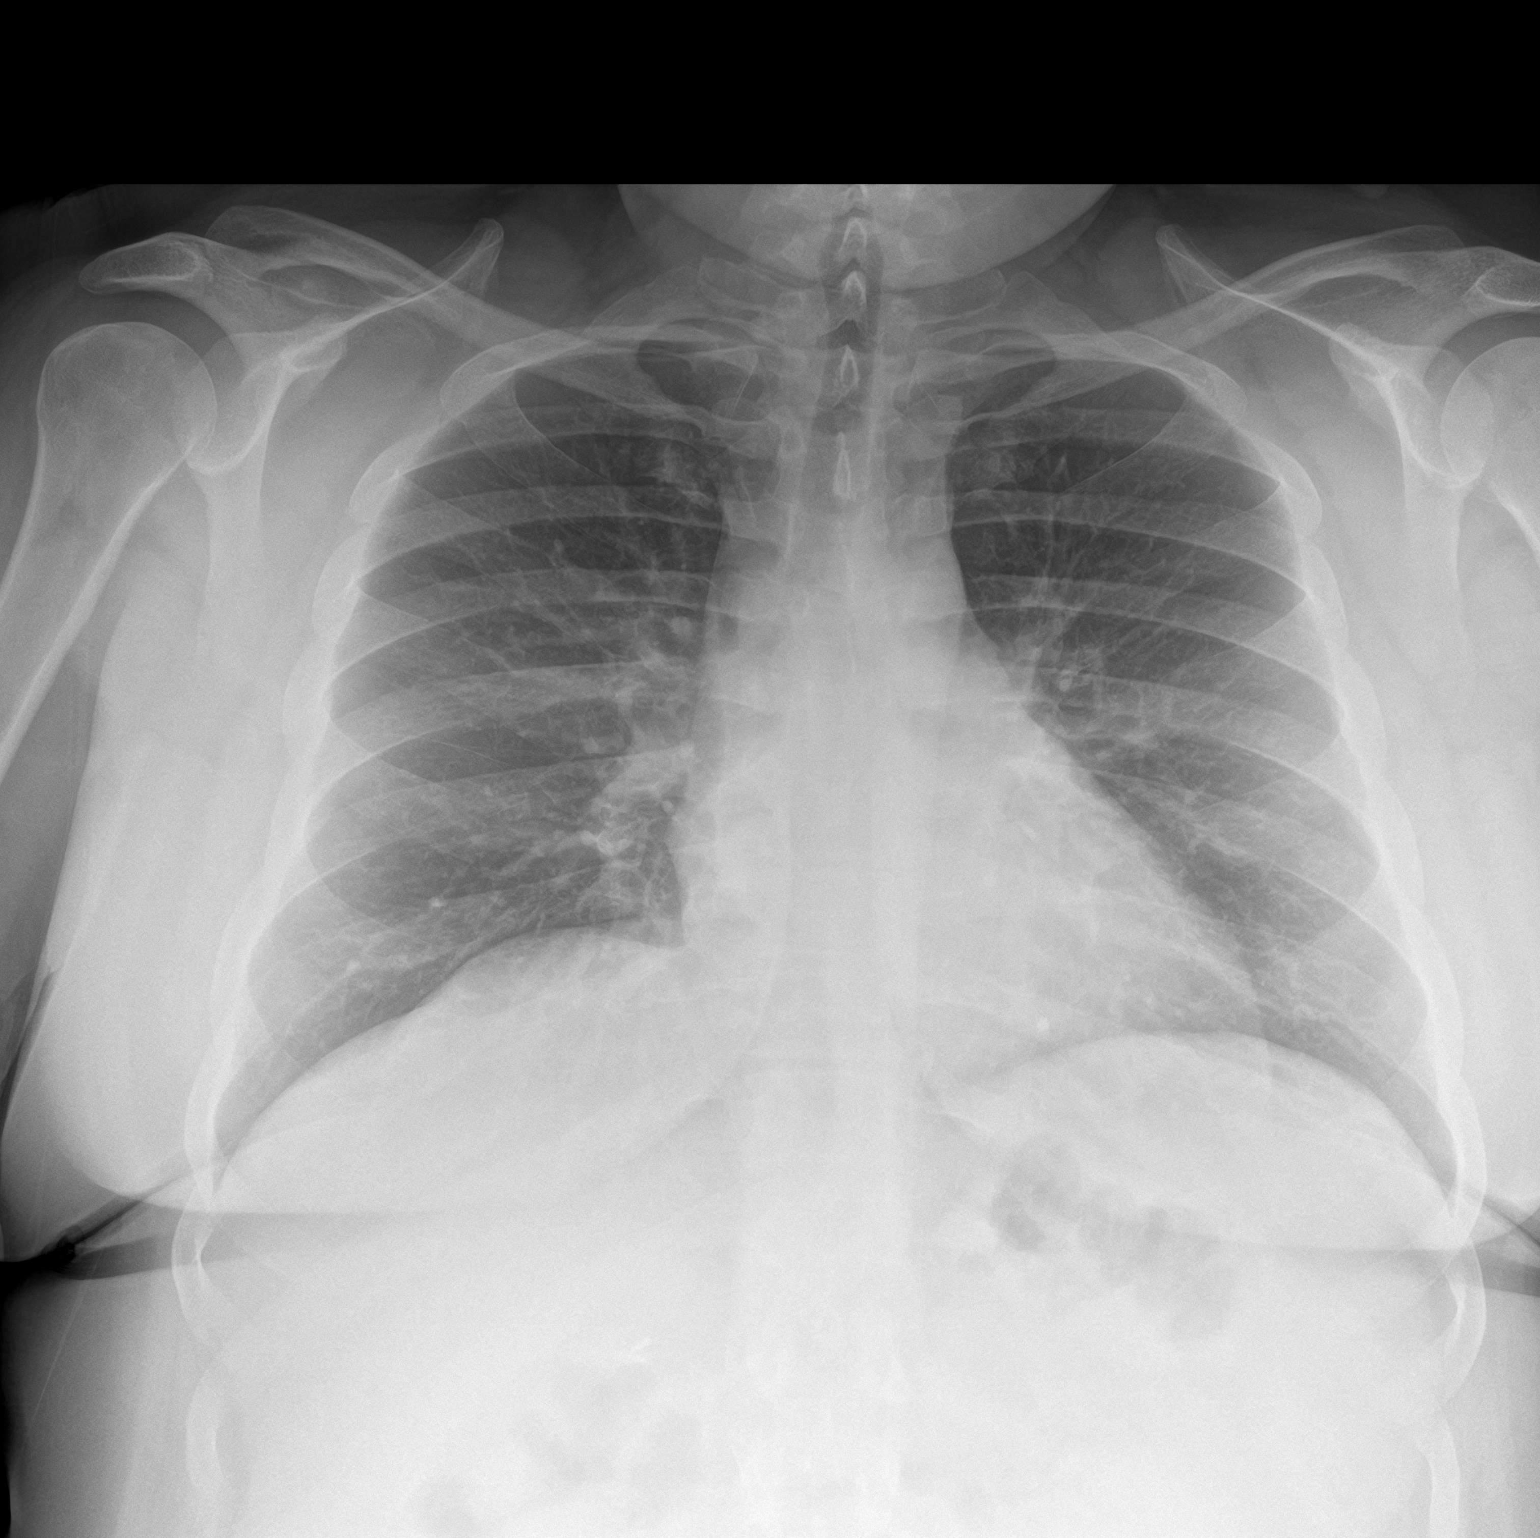

[chest lat]
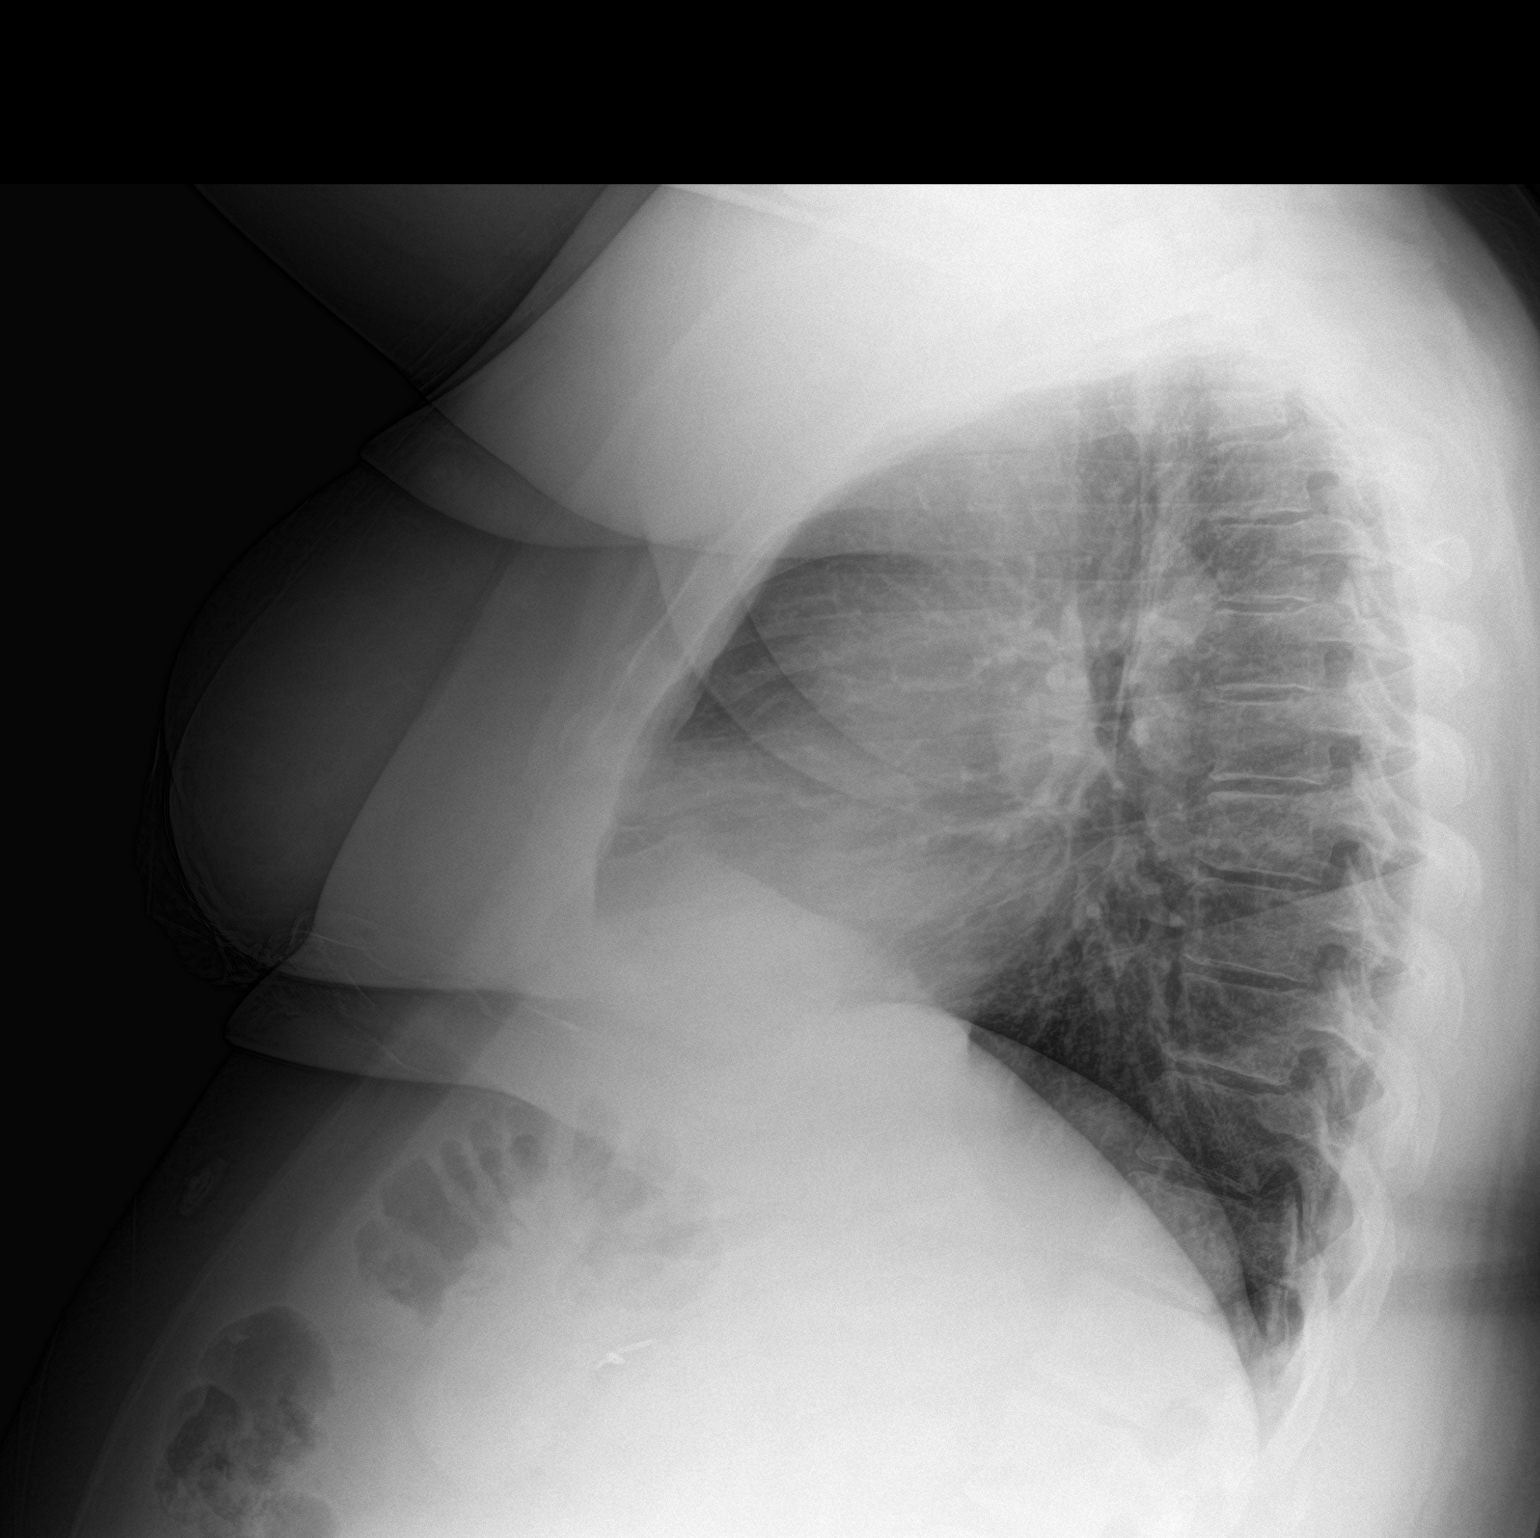

[2 of 2 positions shown; findings below may reference images not displayed]

FINDINGS: The heart size and mediastinal contours are within normal limits.
Both lungs are clear. The visualized skeletal structures are
unremarkable.
IMPRESSION: No active cardiopulmonary disease.

## 2022-10-14 DIAGNOSIS — E538 Deficiency of other specified B group vitamins: Secondary | ICD-10-CM | POA: Diagnosis not present

## 2022-10-14 DIAGNOSIS — E611 Iron deficiency: Secondary | ICD-10-CM | POA: Diagnosis not present

## 2022-10-21 DIAGNOSIS — E611 Iron deficiency: Secondary | ICD-10-CM | POA: Diagnosis not present

## 2022-10-21 DIAGNOSIS — E538 Deficiency of other specified B group vitamins: Secondary | ICD-10-CM | POA: Diagnosis not present

## 2022-11-08 DIAGNOSIS — E611 Iron deficiency: Secondary | ICD-10-CM | POA: Diagnosis not present

## 2022-11-08 DIAGNOSIS — E538 Deficiency of other specified B group vitamins: Secondary | ICD-10-CM | POA: Diagnosis not present

## 2022-12-06 DIAGNOSIS — E538 Deficiency of other specified B group vitamins: Secondary | ICD-10-CM | POA: Diagnosis not present

## 2023-01-30 DIAGNOSIS — M791 Myalgia, unspecified site: Secondary | ICD-10-CM | POA: Diagnosis not present

## 2023-01-30 DIAGNOSIS — J209 Acute bronchitis, unspecified: Secondary | ICD-10-CM | POA: Diagnosis not present

## 2023-01-30 DIAGNOSIS — R059 Cough, unspecified: Secondary | ICD-10-CM | POA: Diagnosis not present

## 2023-01-30 DIAGNOSIS — J3489 Other specified disorders of nose and nasal sinuses: Secondary | ICD-10-CM | POA: Diagnosis not present

## 2023-01-30 DIAGNOSIS — J029 Acute pharyngitis, unspecified: Secondary | ICD-10-CM | POA: Diagnosis not present

## 2023-01-30 DIAGNOSIS — Z1152 Encounter for screening for COVID-19: Secondary | ICD-10-CM | POA: Diagnosis not present

## 2023-02-19 ENCOUNTER — Encounter: Payer: Self-pay | Admitting: Gastroenterology

## 2023-02-19 ENCOUNTER — Ambulatory Visit (INDEPENDENT_AMBULATORY_CARE_PROVIDER_SITE_OTHER): Payer: BC Managed Care – PPO | Admitting: Gastroenterology

## 2023-02-19 VITALS — BP 107/76 | HR 79 | Temp 98.4°F | Ht 65.0 in | Wt 257.7 lb

## 2023-02-19 DIAGNOSIS — K625 Hemorrhage of anus and rectum: Secondary | ICD-10-CM | POA: Insufficient documentation

## 2023-02-19 MED ORDER — HYDROCORTISONE (PERIANAL) 2.5 % EX CREA: 1.0000 | TOPICAL_CREAM | Freq: Two times a day (BID) | CUTANEOUS | 1 refills | Status: DC

## 2023-02-19 NOTE — Progress Notes (Signed)
Gastroenterology Office Note     Primary Care Physician:  Patient, No Pcp Per  Primary Gastroenterologist: Dr. Gala Romney    Chief Complaint   Chief Complaint  Patient presents with   Rectal Bleeding    Patient here today due to seeing bright red blood in the toilet Friday,Sat, Sunday. She reports to have had a bm yesterday and she did not see anything at that time. Patient denies any current issues with dizziness, fatigue, shortness of breath or current palpitations. She does report to have had issues with palpitations in the past.      History of Present Illness   Linda Burnett is a 37 y.o. female presenting today due to rectal bleeding. Colonoscopy last year with tubular adenomas. FH colon cancer in father, diagnosed in his late 66s.   Friday starting having brbpr, in toilet bowl after somewhat harder stool. Saturday streaky, Sunday streaking. No straining. Toilet time 10-15 minutes. BM about daily. No bleeding today. No rectal pain. No prolapse.    Feb 2023 colonoscopy: one 2 mm polyp in transverse, one 5 mm polyp in transverse. Tubular adenoma   Past Medical History:  Diagnosis Date   History of kidney stones    Polycystic ovary syndrome    Prediabetes    Tubular adenoma 2017    Past Surgical History:  Procedure Laterality Date   BIOPSY  02/18/2022   Procedure: BIOPSY;  Surgeon: Eloise Harman, DO;  [facility];  Service: Endoscopy;;   CESAREAN SECTION N/A 03/08/2020   Procedure: CESAREAN SECTION;  Surgeon: Brien Few, MD;  Location: [place] [facility] ORS;  Service: Obstetrics;  Laterality: N/A;   CHOLECYSTECTOMY N/A 08/28/2020   Procedure: LAPAROSCOPIC CHOLECYSTECTOMY;  Surgeon: Aviva Signs, MD;  Location: [facility];  Service: General;  Laterality: N/A;   COLONOSCOPY N/A 04/05/2016   Dr.Rourk- 2-52m polyps at the hepatic flexure, one 434mpolyp in the descending colon, the examined portion of the ileum was normal, the examination was o/w normal on direct and  retroflexion views. bx= tubular adenoma and hyperplastic polyp.   COLONOSCOPY WITH PROPOFOL N/A 02/18/2022   Procedure: COLONOSCOPY WITH PROPOFOL;  Surgeon: CaEloise HarmanDO;  [facility];  Service: Endoscopy;  Laterality: N/A;  12:30pm   DENTAL SURGERY     POLYPECTOMY  02/18/2022   Procedure: POLYPECTOMY;  Surgeon: CaEloise HarmanDO;  [facility];  Service: Endoscopy;;   TONSILLECTOMY     UTERINE SEPTUM RESECTION     "took a uterine septum out before IVF"    Current Outpatient Medications  Medication Sig Dispense Refill   acetaminophen (TYLENOL) 500 MG tablet Take 1,000 mg by mouth every 6 (six) hours as needed for mild pain or headache.     Multiple Vitamin (MULTIVITAMIN) capsule Take 1 capsule by mouth daily.     OVER THE COUNTER MEDICATION Vit D 3 2,000 IU daily     No current facility-administered medications for this visit.    Allergies as of 02/19/2023   (No Known Allergies)    Family History  Problem Relation Age of Onset   Colon cancer Father        late 3045s Diabetes Maternal Grandmother    Diabetes Paternal Grandmother    Colon cancer Paternal Grandmother    Heart attack Maternal Uncle        Died age 36   Social History   Socioeconomic History   Marital status: Married  Spouse name: Not on file   Number of children: Not on file   Years of education: Not on file   Highest education level: Not on file  Occupational History   Occupation: [employer]  Tobacco Use   Smoking status: Former    Types: Cigarettes    Quit date: 08/21/2015    Years since quitting: 7.5   Smokeless tobacco: Never  Vaping Use   Vaping Use: Never used  Substance and Sexual Activity   Alcohol use: No    Alcohol/week: 0.0 standard drinks of alcohol   Drug use: No   Sexual activity: Yes  Other Topics Concern   Not on file  Social History Narrative   Not on file   Social Determinants of Health   Financial Resource Strain: Not on file   Food Insecurity: Not on file  Transportation Needs: Not on file  Physical Activity: Not on file  Stress: Not on file  Social Connections: Not on file  Intimate Partner Violence: Not on file     Review of Systems   Gen: Denies any fever, chills, fatigue, weight loss, lack of appetite.  CV: Denies chest pain, heart palpitations, peripheral edema, syncope.  Resp: Denies shortness of breath at rest or with exertion. Denies wheezing or cough.  GI: Denies dysphagia or odynophagia. Denies jaundice, hematemesis, fecal incontinence. GU : Denies urinary burning, urinary frequency, urinary hesitancy MS: Denies joint pain, muscle weakness, cramps, or limitation of movement.  Derm: Denies rash, itching, dry skin Psych: Denies depression, anxiety, memory loss, and confusion Heme: Denies bruising, bleeding, and enlarged lymph nodes.   Physical Exam   BP 107/76 (BP Location: Right Arm, Patient Position: Sitting, Cuff Size: Large)   Pulse 79   Temp 98.4 F (36.9 C) (Temporal)   Ht 5' 5"$  (1.651 m)   Wt 257 lb 11.2 oz (116.9 kg)   BMI 42.88 kg/m  General:   Alert and oriented. Pleasant and cooperative. Well-nourished and well-developed.  Head:  Normocephalic and atraumatic. Eyes:  Without icterus Rectal:  small external hemorrhoid tags, no mass on DRE, no pain or discomfort. Prominent left lateral column on palpation Msk:  Symmetrical without gross deformities. Normal posture. Extremities:  Without edema. Neurologic:  Alert and  oriented x4;  grossly normal neurologically. Skin:  Intact without significant lesions or rashes. Psych:  Alert and cooperative. Normal mood and affect.   Assessment   Linda Burnett is a 37 y.o. female presenting today due to rectal bleeding most likely secondary to internal hemorrhoids.   DRE without concerning findings. Colonoscopy up-to-date. We discussed hemorrhoid banding in the future if no improvement with supportive measures. She has Grade 1-2  hemorrhoids. Needs to avoid prolonged toilet time as well.    PLAN    Anusol BID for 7 days Benefiber daily Call if no improvement Hemorrhoid banding if continues Return prn Colonoscopy again in 2028   Annitta Needs, PhD, ANP-BC [facility]

## 2023-02-19 NOTE — Patient Instructions (Signed)
I have sent in Anusol rectal cream to use twice a day for the next 5-7 days.  Avoid straining, limit toilet time to 2-3 minutes at a time.  I recommend Benefiber 2 teaspoons every morning in the beverage of your choice.   Please call if persistent bleeding!  We will see you back as needed, and we can do hemorrhoid banding if you continue to have issues.   I enjoyed seeing you again today! At our first visit, I mentioned how I value our relationship and want to provide genuine, compassionate, and quality care. You may receive a survey regarding your visit with me, and I welcome your feedback! Thanks so much for taking the time to complete this. I look forward to seeing you again.   Annitta Needs, PhD, ANP-BC [facility]

## 2023-08-01 DIAGNOSIS — R7309 Other abnormal glucose: Secondary | ICD-10-CM | POA: Diagnosis not present

## 2023-08-01 DIAGNOSIS — E559 Vitamin D deficiency, unspecified: Secondary | ICD-10-CM | POA: Diagnosis not present

## 2023-08-01 DIAGNOSIS — E538 Deficiency of other specified B group vitamins: Secondary | ICD-10-CM | POA: Diagnosis not present

## 2023-08-01 DIAGNOSIS — D72820 Lymphocytosis (symptomatic): Secondary | ICD-10-CM | POA: Diagnosis not present

## 2023-08-01 DIAGNOSIS — E611 Iron deficiency: Secondary | ICD-10-CM | POA: Diagnosis not present

## 2023-09-29 DIAGNOSIS — J329 Chronic sinusitis, unspecified: Secondary | ICD-10-CM | POA: Diagnosis not present

## 2023-10-23 ENCOUNTER — Encounter: Payer: BC Managed Care – PPO | Admitting: Obstetrics and Gynecology

## 2023-12-29 ENCOUNTER — Ambulatory Visit (INDEPENDENT_AMBULATORY_CARE_PROVIDER_SITE_OTHER): Payer: BC Managed Care – PPO | Admitting: Obstetrics and Gynecology

## 2023-12-29 ENCOUNTER — Encounter: Payer: Self-pay | Admitting: Obstetrics and Gynecology

## 2023-12-29 ENCOUNTER — Other Ambulatory Visit (HOSPITAL_COMMUNITY)
Admission: RE | Admit: 2023-12-29 | Discharge: 2023-12-29 | Disposition: A | Discharge status: Home / Self Care | Payer: BC Managed Care – PPO | Source: Ambulatory Visit | Attending: Obstetrics and Gynecology | Admitting: Obstetrics and Gynecology

## 2023-12-29 VITALS — BP 133/83 | HR 73 | Ht 65.0 in | Wt 255.0 lb

## 2023-12-29 DIAGNOSIS — Z3009 Encounter for other general counseling and advice on contraception: Secondary | ICD-10-CM | POA: Diagnosis not present

## 2023-12-29 DIAGNOSIS — Z01419 Encounter for gynecological examination (general) (routine) without abnormal findings: Secondary | ICD-10-CM | POA: Insufficient documentation

## 2023-12-29 NOTE — Progress Notes (Signed)
Pt. Presents to est. Care. Pt declines any std testing. Pt. Has no concerns at this time.

## 2023-12-29 NOTE — Progress Notes (Signed)
ANNUAL EXAM Patient name: Linda Burnett MRN [id number]  Date of birth: Apr 11, 1986 Chief Complaint:   Establish Care  History of Present Illness:   Linda Burnett is a 37 y.o. G41P1011  female being seen today for a routine annual exam.  Current complaints: history of c/s in 2021, history of uterine septum resection around 2019-2020), fibroid tumor removal years ago (prior to c/s). Not doing anything for pregnancy prevention, would like another baby. Having normal period since July, usually has abnormal. Was seeing [facility] to [facility] first baby. 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 Was on metformin in the past and was taken off of it.    Patient's last menstrual period was 12/15/2023 (approximate).   The pregnancy intention screening data noted above was reviewed. Potential methods of contraception were discussed. The patient elected to proceed with No data recorded.   Last pap three years. Results were:  NILM . H/O abnormal pap: no Last mammogram: n/a. Results were: N/A. Family h/o breast cancer: no Last colonoscopy: 02/18/22. Results were: normal. Family h/o colorectal cancer: yes father       No data to display               No data to display          Review of Systems:   Pertinent items are noted in HPI Denies any headaches, blurred vision, fatigue, shortness of breath, chest pain, abdominal pain, abnormal vaginal discharge/itching/odor/irritation, problems with periods, bowel movements, urination, or intercourse unless otherwise stated above. Pertinent History Reviewed:  Reviewed past medical,surgical, social and family history.  Reviewed problem list, medications and allergies. Physical Assessment:   Vitals:   12/29/23 1303 12/29/23 1324  BP: (!) 135/91 133/83  Pulse: 80 73  Weight: 255 lb (115.7 kg)   Height: 5\' 5"  (1.651 m)   Body mass index is 42.43 kg/m.         Physical Examination:   General appearance - well appearing, and in no distress  Mental status - alert, oriented to person, place, and time  Psych:  She has a normal mood and affect  Skin - warm and dry, normal color, no suspicious lesions noted  Chest - effort normal, all lung fields clear to auscultation bilaterally  Heart - normal rate and regular rhythm  Neck:  midline trachea  Breasts - breasts appear normal, no suspicious masses, no skin or nipple changes or  axillary nodes  Abdomen - soft  Pelvic - VULVA: normal appearing vulva with no masses, tenderness or lesions  VAGINA: normal appearing vagina with normal color and discharge, no lesions  CERVIX: normal appearing cervix without discharge or lesions, no CMT  Thin prep pap is done w HR HPV cotesting  Extremities:  No swelling or varicosities noted  Chaperone present for exam  No results found for this or any previous visit (from the past 24 hours).  Assessment & Plan:  1. Well woman exam (Primary) Encourage self breast exams, mammogram at 41  Is scheduling PCP appointment and will do routine blood work there . Checks BPs at home which are always normal  - Cytology - PAP( [facility])  2. Family planning Discussed likely needing fertility clinic again. Has never had normal cycle and has recently had normal cycles. Going to try  OPK, discussed possibility of ovulation induction agents   Labs/procedures today:   Mammogram: @ 37yo, or sooner if problems  Colonoscopy: per GI, or sooner if problems  No orders of the defined types were placed in this encounter.   Meds: No orders of the defined types were placed in this encounter.   Follow-up: Return 2-3 month follow up fertility with MD.   Albertine Grates, FNP

## 2024-01-02 LAB — CYTOLOGY - PAP
Adequacy: ABSENT
Comment: NEGATIVE
Diagnosis: NEGATIVE
High risk HPV: NEGATIVE

## 2024-09-21 DIAGNOSIS — R059 Cough, unspecified: Secondary | ICD-10-CM | POA: Diagnosis not present

## 2024-09-21 DIAGNOSIS — M791 Myalgia, unspecified site: Secondary | ICD-10-CM | POA: Diagnosis not present

## 2024-09-21 DIAGNOSIS — R0981 Nasal congestion: Secondary | ICD-10-CM | POA: Diagnosis not present

## 2024-09-21 DIAGNOSIS — J40 Bronchitis, not specified as acute or chronic: Secondary | ICD-10-CM | POA: Diagnosis not present

## 2024-09-21 DIAGNOSIS — Z87891 Personal history of nicotine dependence: Secondary | ICD-10-CM | POA: Diagnosis not present

## 2024-09-21 DIAGNOSIS — J209 Acute bronchitis, unspecified: Secondary | ICD-10-CM | POA: Diagnosis not present

## 2024-09-21 DIAGNOSIS — E119 Type 2 diabetes mellitus without complications: Secondary | ICD-10-CM | POA: Diagnosis not present

## 2024-09-21 DIAGNOSIS — J9809 Other diseases of bronchus, not elsewhere classified: Secondary | ICD-10-CM | POA: Diagnosis not present

## 2024-09-21 DIAGNOSIS — R058 Other specified cough: Secondary | ICD-10-CM | POA: Diagnosis not present

## 2024-09-21 DIAGNOSIS — R0989 Other specified symptoms and signs involving the circulatory and respiratory systems: Secondary | ICD-10-CM | POA: Diagnosis not present

## 2024-10-12 DIAGNOSIS — M25572 Pain in left ankle and joints of left foot: Secondary | ICD-10-CM | POA: Diagnosis not present

## 2024-10-13 ENCOUNTER — Ambulatory Visit: Admitting: Obstetrics & Gynecology

## 2024-10-13 ENCOUNTER — Other Ambulatory Visit: Payer: Self-pay | Admitting: Obstetrics & Gynecology

## 2024-10-13 VITALS — BP 122/85 | HR 73 | Ht 65.0 in | Wt 263.0 lb

## 2024-10-13 DIAGNOSIS — N6011 Diffuse cystic mastopathy of right breast: Secondary | ICD-10-CM

## 2024-10-13 NOTE — Progress Notes (Signed)
 Patient ID: Linda Burnett, female   DOB: 16-Sep-1986, 38 y.o.   MRN: [id number]  Chief Complaint  Patient presents with   Breast Problem    HPI Linda Burnett is a 38 y.o. female.  She felt a small lump in her right breast when doing BSE about 2 weeks ago. There was slight tenderness but the area is hard to detect now. HPI  Past Medical History:  Diagnosis Date   History of kidney stones    Polycystic ovary syndrome    Prediabetes    Tubular adenoma 2017    Past Surgical History:  Procedure Laterality Date   BIOPSY  02/18/2022   Procedure: BIOPSY;  Surgeon: Cindie Carlin POUR, DO;  Location: [facility];  Service: Endoscopy;;   CESAREAN SECTION N/A 03/08/2020   Procedure: CESAREAN SECTION;  Surgeon: Gorge Ade, MD;  Location: [facility];  Service: Obstetrics;  Laterality: N/A;   CHOLECYSTECTOMY N/A 08/28/2020   Procedure: LAPAROSCOPIC CHOLECYSTECTOMY;  Surgeon: Mavis Anes, MD;  Location: [facility];  Service: General;  Laterality: N/A;   COLONOSCOPY N/A 04/05/2016   Dr.Rourk- 2-59mm polyps at the hepatic flexure, one 4mm polyp in the descending colon, the examined portion of the ileum was normal, the examination was o/w normal on direct and retroflexion views. bx= tubular adenoma and hyperplastic polyp.   COLONOSCOPY WITH PROPOFOL  N/A 02/18/2022   Procedure: COLONOSCOPY WITH PROPOFOL ;  Surgeon: Cindie Carlin POUR, DO;  Location: [facility];  Service: Endoscopy;  Laterality: N/A;  12:30pm   DENTAL SURGERY     POLYPECTOMY  02/18/2022   Procedure: POLYPECTOMY;  Surgeon: Cindie Carlin POUR, DO;  Location: [facility];  Service: Endoscopy;;   TONSILLECTOMY     UTERINE SEPTUM RESECTION     took a uterine septum out before IVF    Family History  Adopted: Yes  Problem Relation Age of Onset   Colon cancer Father        late 64s   Diabetes Maternal Grandmother    Diabetes Paternal Grandmother    Colon cancer Paternal Grandmother    Heart attack Maternal Uncle        Died age 42      Social History Social History   Tobacco Use   Smoking status: Former    Current packs/day: 0.00    Types: Cigarettes    Quit date: 08/21/2015    Years since quitting: 9.1   Smokeless tobacco: Never  Vaping Use   Vaping status: Never Used  Substance Use Topics   Alcohol use: No    Alcohol/week: 0.0 standard drinks of alcohol   Drug use: No    No Known Allergies  Current Outpatient Medications  Medication Sig Dispense Refill   Multiple Vitamin (MULTIVITAMIN) capsule Take 1 capsule by mouth daily.     OVER THE COUNTER MEDICATION Vit D 3 2,000 IU daily     No current facility-administered medications for this visit.    Review of Systems Review of Systems  All other systems reviewed and are negative.   Blood pressure 122/85, pulse 73, height 5' 5 (1.651 m), weight 263 lb (119.3 kg), last menstrual period 09/16/2024.  Physical Exam Physical Exam Vitals and nursing note reviewed. Exam conducted with a chaperone present.  Constitutional:      Appearance: She is obese. She is not ill-appearing.  Cardiovascular:     Rate and Rhythm: Normal rate.  Pulmonary:     Effort: Pulmonary effort is normal.  Chest:  Breasts:  Breasts are symmetrical.     Right: Normal. No tenderness.     Left: Normal. No tenderness.       Comments: Mild fibrocystic changes bilaterally, patient points to spot indicated on image Neurological:     General: No focal deficit present.     Mental Status: She is alert.  Psychiatric:        Mood and Affect: Mood normal.        Behavior: Behavior normal.     Data Reviewed   Assessment Fibrocystic changes of right breast - Plan: MM 3D DIAGNOSTIC MAMMOGRAM BILATERAL BREAST, CANCELED: MM Digital Diagnostic Bilat   Plan Orders Placed This Encounter  Procedures   MM 3D DIAGNOSTIC MAMMOGRAM BILATERAL BREAST    PWD:[facility] ID: [id number] PREV:NONE  NO SURGERY/ NO IMPLANTS OR REDUCTION/ NO BREAST CANCER// NO NEEDS AJ SW PT  PT  AWARE $75 NO SHOW/CANCELLATION FEE WITHIN 24 HOURS  SEND FOR COSIGN 10/13/2024    Standing Status:   Future    Expiration Date:   10/12/2025    Reason for Exam (SYMPTOM  OR DIAGNOSIS REQUIRED):   fibrocystic right breast changes    Is the patient pregnant?:   No    Preferred imaging location?:   [facility]       Lynwood Solomons 10/13/2024, 1:34 PM

## 2024-10-13 NOTE — Progress Notes (Signed)
 Pt states she noticed pea sized lump in right breast a couple weeks ago. Did have some tenderness.  Pt doesn't really notice now but would like exam.

## 2024-10-20 ENCOUNTER — Ambulatory Visit
Admission: RE | Admit: 2024-10-20 | Discharge: 2024-10-20 | Disposition: A | Source: Ambulatory Visit | Attending: Obstetrics & Gynecology | Admitting: Obstetrics & Gynecology

## 2024-10-20 ENCOUNTER — Other Ambulatory Visit: Payer: Self-pay | Admitting: Obstetrics & Gynecology

## 2024-10-20 ENCOUNTER — Ambulatory Visit

## 2024-10-20 DIAGNOSIS — N6011 Diffuse cystic mastopathy of right breast: Secondary | ICD-10-CM

## 2024-10-20 DIAGNOSIS — R928 Other abnormal and inconclusive findings on diagnostic imaging of breast: Secondary | ICD-10-CM | POA: Diagnosis not present
